# Patient Record
Sex: Male | Born: 1990 | Race: White | Hispanic: No | Marital: Married | Smoking: Former smoker
Health system: Southern US, Community
[De-identification: ages and names within clinical notes are randomized; demographics above are authoritative.]

## PROBLEM LIST (undated history)

## (undated) ENCOUNTER — Emergency Department (HOSPITAL_COMMUNITY): Admission: EM | Payer: BLUE CROSS/BLUE SHIELD | Source: Home / Self Care

## (undated) DIAGNOSIS — N200 Calculus of kidney: Secondary | ICD-10-CM

## (undated) DIAGNOSIS — Q8740 Marfan's syndrome, unspecified: Secondary | ICD-10-CM

## (undated) HISTORY — DX: Calculus of kidney: N20.0

## (undated) HISTORY — PX: TYMPANOSTOMY TUBE PLACEMENT: SHX32

---
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
## 2010-01-24 ENCOUNTER — Emergency Department: Payer: Self-pay | Admitting: Emergency Medicine

## 2010-07-24 ENCOUNTER — Emergency Department: Payer: Self-pay | Admitting: Internal Medicine

## 2010-12-18 ENCOUNTER — Encounter: Payer: Self-pay | Admitting: Physical Medicine and Rehabilitation

## 2011-01-02 ENCOUNTER — Encounter: Payer: Self-pay | Admitting: Physical Medicine and Rehabilitation

## 2011-11-16 ENCOUNTER — Emergency Department: Payer: Self-pay | Admitting: *Deleted

## 2013-05-25 ENCOUNTER — Emergency Department: Payer: Self-pay | Admitting: Emergency Medicine

## 2013-05-25 LAB — COMPREHENSIVE METABOLIC PANEL WITH GFR
Albumin: 4.6 g/dL
Alkaline Phosphatase: 59 U/L
Anion Gap: 4 — ABNORMAL LOW
BUN: 12 mg/dL
Bilirubin,Total: 0.6 mg/dL
Calcium, Total: 9.6 mg/dL
Chloride: 105 mmol/L
Co2: 28 mmol/L
Creatinine: 0.91 mg/dL
EGFR (African American): >60
EGFR (Non-African Amer.): >60
Glucose: 81 mg/dL
Osmolality: 273
Potassium: 4 mmol/L
SGOT(AST): 27 U/L
SGPT (ALT): 33 U/L
Sodium: 137 mmol/L
Total Protein: 8.4 g/dL — ABNORMAL HIGH

## 2013-05-25 LAB — URINALYSIS, COMPLETE
Bacteria: NONE SEEN
Bilirubin,UR: NEGATIVE
Glucose,UR: NEGATIVE mg/dL
Hyaline Cast: 2
Ketone: NEGATIVE
Leukocyte Esterase: NEGATIVE
Nitrite: NEGATIVE
Ph: 5
Protein: NEGATIVE
RBC,UR: 22 /HPF
Specific Gravity: 1.019
Squamous Epithelial: <1
WBC UR: 1 /HPF

## 2013-05-25 LAB — CBC
HCT: 46.1 % (ref 40.0–52.0)
HGB: 15.9 g/dL (ref 13.0–18.0)
MCH: 29.4 pg (ref 26.0–34.0)
MCHC: 34.4 g/dL (ref 32.0–36.0)
MCV: 86 fL (ref 80–100)
Platelet: 191 10*3/uL (ref 150–440)
RBC: 5.39 10*6/uL (ref 4.40–5.90)
RDW: 12.7 % (ref 11.5–14.5)

## 2013-05-25 LAB — TROPONIN I: Troponin-I: <0.02 ng/mL

## 2013-05-25 LAB — LIPASE, BLOOD: Lipase: 106 U/L

## 2013-05-30 DIAGNOSIS — N4889 Other specified disorders of penis: Secondary | ICD-10-CM | POA: Insufficient documentation

## 2013-05-30 DIAGNOSIS — R39198 Other difficulties with micturition: Secondary | ICD-10-CM | POA: Insufficient documentation

## 2013-05-30 DIAGNOSIS — R3911 Hesitancy of micturition: Secondary | ICD-10-CM | POA: Insufficient documentation

## 2013-05-30 DIAGNOSIS — R3129 Other microscopic hematuria: Secondary | ICD-10-CM | POA: Insufficient documentation

## 2013-12-28 ENCOUNTER — Emergency Department: Payer: Self-pay | Admitting: Emergency Medicine

## 2013-12-28 LAB — CBC WITH DIFFERENTIAL/PLATELET
BASOS ABS: 0.2 10*3/uL — AB (ref 0.0–0.1)
Basophil %: 3 %
Eosinophil #: 0.2 10*3/uL (ref 0.0–0.7)
Eosinophil %: 3.3 %
HCT: 43.6 % (ref 40.0–52.0)
HGB: 14.5 g/dL (ref 13.0–18.0)
LYMPHS ABS: 1.2 10*3/uL (ref 1.0–3.6)
LYMPHS PCT: 17.3 %
MCH: 28.8 pg (ref 26.0–34.0)
MCHC: 33.3 g/dL (ref 32.0–36.0)
MCV: 87 fL (ref 80–100)
MONO ABS: 0.8 x10 3/mm (ref 0.2–1.0)
MONOS PCT: 10.5 %
NEUTROS ABS: 4.7 10*3/uL (ref 1.4–6.5)
Neutrophil %: 65.9 %
Platelet: 183 10*3/uL (ref 150–440)
RBC: 5.04 10*6/uL (ref 4.40–5.90)
RDW: 12.6 % (ref 11.5–14.5)
WBC: 7.2 10*3/uL (ref 3.8–10.6)

## 2013-12-28 LAB — URINALYSIS, COMPLETE
BACTERIA: NONE SEEN
Bilirubin,UR: NEGATIVE
Glucose,UR: NEGATIVE mg/dL (ref 0–75)
LEUKOCYTE ESTERASE: NEGATIVE
NITRITE: NEGATIVE
Ph: 7 (ref 4.5–8.0)
SPECIFIC GRAVITY: 1.018 (ref 1.003–1.030)
SQUAMOUS EPITHELIAL: NONE SEEN
WBC UR: NONE SEEN /HPF (ref 0–5)

## 2013-12-28 LAB — COMPREHENSIVE METABOLIC PANEL
ALBUMIN: 4.2 g/dL (ref 3.4–5.0)
ALK PHOS: 47 U/L
ALT: 18 U/L (ref 12–78)
AST: 15 U/L (ref 15–37)
Anion Gap: 10 (ref 7–16)
BUN: 7 mg/dL (ref 7–18)
Bilirubin,Total: 0.9 mg/dL (ref 0.2–1.0)
CO2: 24 mmol/L (ref 21–32)
CREATININE: 0.75 mg/dL (ref 0.60–1.30)
Calcium, Total: 9.4 mg/dL (ref 8.5–10.1)
Chloride: 103 mmol/L (ref 98–107)
EGFR (Non-African Amer.): >60
GLUCOSE: 90 mg/dL (ref 65–99)
OSMOLALITY: 271 (ref 275–301)
POTASSIUM: 3.5 mmol/L (ref 3.5–5.1)
Sodium: 137 mmol/L (ref 136–145)
TOTAL PROTEIN: 7.8 g/dL (ref 6.4–8.2)

## 2013-12-28 LAB — LIPASE, BLOOD: Lipase: 86 U/L (ref 73–393)

## 2014-05-21 DIAGNOSIS — F419 Anxiety disorder, unspecified: Secondary | ICD-10-CM | POA: Insufficient documentation

## 2014-05-21 DIAGNOSIS — R251 Tremor, unspecified: Secondary | ICD-10-CM | POA: Insufficient documentation

## 2014-11-09 ENCOUNTER — Encounter: Payer: Self-pay | Admitting: Cardiology

## 2014-11-09 ENCOUNTER — Ambulatory Visit (INDEPENDENT_AMBULATORY_CARE_PROVIDER_SITE_OTHER): Payer: BLUE CROSS/BLUE SHIELD | Admitting: Cardiology

## 2014-11-09 VITALS — BP 124/86 | HR 74 | Ht 78.0 in | Wt 174.0 lb

## 2014-11-09 DIAGNOSIS — Q8740 Marfan's syndrome, unspecified: Secondary | ICD-10-CM

## 2014-11-09 NOTE — Progress Notes (Signed)
Cardiology Office Note   Date:  11/09/2014   ID:  Tom [name], DOB 11-14-1990, MRN [id number]  PCP:  No primary care provider on file.  Cardiologist:   Rollene Rotunda, MD   Chief Complaint  Patient presents with  . Rule out Marfans      History of Present Illness: Tom [name] is a 24 y.o. male who presents to rule out Marfan's. His paternal grand father had this. He was surveyed as a child with an ultrasound was never given a diagnosis and then did not want to go back for follow-up. He's now expecting his first child and it was suggested by his wife's OB/GYN that he get tested for Marfan's. He has been well. He has a very physical job. He does have some chest soreness because he lives quite a bit. This is a sternal discomfort. This happens while he is working and can happen afterwards. He does not describe a substernal discomfort however. He doesn't have any palpitations, presyncope or syncope. He denies any shortness of breath, PND or orthopnea. He's had no weight gain or edema.    Past Medical History  Diagnosis Date  . Kidney stones     Past Surgical History  Procedure Laterality Date  . Tympanostomy tube placement      multiple times as a child     Current Outpatient Prescriptions  Medication Sig Dispense Refill  . oxyCODONE (OXY IR/ROXICODONE) 5 MG immediate release tablet Take 5 mg by mouth.     No current facility-administered medications for this visit.    Allergies:   Gabapentin    Social History:  The patient  reports that he has quit smoking. He does not have any smokeless tobacco history on file. He reports that he drinks alcohol.   Family History:  The patient's family history includes Cancer in his mother; Hypertension in his father; Marfan syndrome in his maternal grandfather; Pectus excavatum in his mother.    ROS:  Please see the history of present illness.   Otherwise, review of systems are positive for none.   All other systems are reviewed  and negative.    PHYSICAL EXAM: VS:  BP 124/86 mmHg  Ht  (1.981 m)  Wt 174 lb (78.926 kg)  BMI 20.11 kg/m2 , BMI Body mass index is 20.11 kg/(m^2). GENERAL:  Well appearing HEENT:  Pupils equal round and reactive, fundi not visualized, oral mucosa unremarkable, high arching palate NECK:  No jugular venous distention, waveform within normal limits, carotid upstroke brisk and symmetric, no bruits, no thyromegaly LYMPHATICS:  No cervical, inguinal adenopathy LUNGS:  Clear to auscultation bilaterally BACK:  No CVA tenderness CHEST:  Unremarkable HEART:  PMI not displaced or sustained,S1 and S2 within normal limits, no S3, no S4, no clicks, no rubs, no murmurs ABD:  Flat, positive bowel sounds normal in frequency in pitch, no bruits, no rebound, no guarding, no midline pulsatile mass, no hepatomegaly, no splenomegaly EXT:  2 plus pulses throughout, no edema, no cyanosis no clubbing, 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 The ekg ordered today demonstrates sinus rhythm, rate 74, axis within normal limits, intervals within normal limits, early repolarization pattern.   Recent Labs: No results found for requested labs within last 365 days.  Lipid Panel No results found for: CHOL, TRIG, HDL, CHOLHDL, VLDL, LDLCALC, LDLDIRECT    Wt Readings from Last 3 Encounters:  11/09/14 174 lb (78.926 kg)      Other studies Reviewed: Additional studies/ records that were reviewed today include: None.    ASSESSMENT AND PLAN:  RULE OUT MARFANS:  The patient has the body habitus consistent with this although I have not yet done the official scoring. I suspect that he does have this. He has a family history. This will be one criteria. I will start with a Z score  calculation on his echo. This will be enough to make a diagnosis. If not we will consider other workup such as slipped lamp eye exam.  I will start with an echocardiogram.   Current medicines are reviewed at length with the patient today.  The patient does not have concerns regarding medicines.  The following changes have been made:  no change  Labs/ tests ordered today include: Echo  No orders of the defined types were placed in this encounter.     Disposition:   FU with me in one year.     Signed, Rollene RotundaJames Sharlee Rufino, MD  11/09/2014 8:58 AM    [facility]

## 2014-11-09 NOTE — Patient Instructions (Signed)
Your physician wants you to follow-up in: 1 Year. You will receive a reminder letter in the mail two months in advance. If you don't receive a letter, please call our office to schedule the follow-up appointment.  Your physician has requested that you have an echocardiogram. Echocardiography is a painless test that uses sound waves to create images of your heart. It provides your doctor with information about the size and shape of your heart and how well your heart's Kassabian and valves are working. This procedure takes approximately one hour. There are no restrictions for this procedure.    

## 2014-11-14 ENCOUNTER — Ambulatory Visit (HOSPITAL_COMMUNITY)
Admission: RE | Admit: 2014-11-14 | Discharge: 2014-11-14 | Disposition: A | Discharge status: Home / Self Care | Payer: BLUE CROSS/BLUE SHIELD | Source: Ambulatory Visit | Attending: Internal Medicine | Admitting: Internal Medicine

## 2014-11-14 DIAGNOSIS — Q8740 Marfan's syndrome, unspecified: Secondary | ICD-10-CM | POA: Insufficient documentation

## 2014-11-14 NOTE — Progress Notes (Signed)
2D Echo Performed 11/14/2014    Leigh Kaeding, RCS  

## 2014-11-16 DIAGNOSIS — Q8740 Marfan's syndrome, unspecified: Secondary | ICD-10-CM | POA: Insufficient documentation

## 2015-09-24 DIAGNOSIS — R109 Unspecified abdominal pain: Secondary | ICD-10-CM | POA: Insufficient documentation

## 2015-09-24 DIAGNOSIS — S3991XA Unspecified injury of abdomen, initial encounter: Secondary | ICD-10-CM | POA: Insufficient documentation

## 2015-09-24 DIAGNOSIS — D649 Anemia, unspecified: Secondary | ICD-10-CM | POA: Insufficient documentation

## 2015-09-24 HISTORY — DX: Unspecified injury of abdomen, initial encounter: S39.91XA

## 2015-10-02 DIAGNOSIS — N200 Calculus of kidney: Secondary | ICD-10-CM | POA: Insufficient documentation

## 2015-10-02 DIAGNOSIS — H9190 Unspecified hearing loss, unspecified ear: Secondary | ICD-10-CM | POA: Insufficient documentation

## 2015-10-02 DIAGNOSIS — F339 Major depressive disorder, recurrent, unspecified: Secondary | ICD-10-CM | POA: Insufficient documentation

## 2015-10-02 DIAGNOSIS — R0789 Other chest pain: Secondary | ICD-10-CM | POA: Insufficient documentation

## 2015-10-21 DIAGNOSIS — R319 Hematuria, unspecified: Secondary | ICD-10-CM | POA: Insufficient documentation

## 2018-10-05 ENCOUNTER — Emergency Department (HOSPITAL_COMMUNITY)
Admission: EM | Admit: 2018-10-05 | Discharge: 2018-10-05 | Disposition: A | Discharge status: Home / Self Care | Payer: 59 | Attending: Emergency Medicine | Admitting: Emergency Medicine

## 2018-10-05 ENCOUNTER — Emergency Department (HOSPITAL_COMMUNITY): Payer: 59

## 2018-10-05 ENCOUNTER — Encounter (HOSPITAL_COMMUNITY): Payer: Self-pay | Admitting: *Deleted

## 2018-10-05 ENCOUNTER — Other Ambulatory Visit: Payer: Self-pay

## 2018-10-05 DIAGNOSIS — R319 Hematuria, unspecified: Secondary | ICD-10-CM | POA: Insufficient documentation

## 2018-10-05 DIAGNOSIS — R1032 Left lower quadrant pain: Secondary | ICD-10-CM | POA: Diagnosis present

## 2018-10-05 DIAGNOSIS — N200 Calculus of kidney: Secondary | ICD-10-CM | POA: Diagnosis not present

## 2018-10-05 DIAGNOSIS — Z87891 Personal history of nicotine dependence: Secondary | ICD-10-CM | POA: Insufficient documentation

## 2018-10-05 DIAGNOSIS — R109 Unspecified abdominal pain: Secondary | ICD-10-CM

## 2018-10-05 DIAGNOSIS — R112 Nausea with vomiting, unspecified: Secondary | ICD-10-CM | POA: Diagnosis not present

## 2018-10-05 HISTORY — DX: Calculus of kidney: N20.0

## 2018-10-05 LAB — CBC WITH DIFFERENTIAL/PLATELET
ABS IMMATURE GRANULOCYTES: 0.03 10*3/uL (ref 0.00–0.07)
Basophils Absolute: 0 10*3/uL (ref 0.0–0.1)
Basophils Relative: 1 %
Eosinophils Absolute: 0.4 10*3/uL (ref 0.0–0.5)
Eosinophils Relative: 5 %
HCT: 39.8 % (ref 39.0–52.0)
Hemoglobin: 13.1 g/dL (ref 13.0–17.0)
IMMATURE GRANULOCYTES: 1 %
Lymphocytes Relative: 23 %
Lymphs Abs: 1.5 10*3/uL (ref 0.7–4.0)
MCH: 28.2 pg (ref 26.0–34.0)
MCHC: 32.9 g/dL (ref 30.0–36.0)
MCV: 85.8 fL (ref 80.0–100.0)
Monocytes Absolute: 1 10*3/uL (ref 0.1–1.0)
Monocytes Relative: 15 %
NEUTROS ABS: 3.7 10*3/uL (ref 1.7–7.7)
NEUTROS PCT: 55 %
NRBC: 0 % (ref 0.0–0.2)
PLATELETS: 191 10*3/uL (ref 150–400)
RBC: 4.64 MIL/uL (ref 4.22–5.81)
RDW: 12.2 % (ref 11.5–15.5)
WBC: 6.5 10*3/uL (ref 4.0–10.5)

## 2018-10-05 LAB — BASIC METABOLIC PANEL WITH GFR
Anion gap: 8 (ref 5–15)
BUN: 9 mg/dL (ref 6–20)
CO2: 26 mmol/L (ref 22–32)
Calcium: 9.4 mg/dL (ref 8.9–10.3)
Chloride: 102 mmol/L (ref 98–111)
Creatinine, Ser: 0.88 mg/dL (ref 0.61–1.24)
GFR calc Af Amer: >60 mL/min
GFR calc non Af Amer: >60 mL/min
Glucose, Bld: 69 mg/dL — ABNORMAL LOW (ref 70–99)
Potassium: 3.7 mmol/L (ref 3.5–5.1)
Sodium: 136 mmol/L (ref 135–145)

## 2018-10-05 LAB — URINALYSIS, ROUTINE W REFLEX MICROSCOPIC
Bilirubin Urine: NEGATIVE
Glucose, UA: NEGATIVE mg/dL
Hgb urine dipstick: NEGATIVE
Ketones, ur: NEGATIVE mg/dL
LEUKOCYTE UA: NEGATIVE
Nitrite: NEGATIVE
PROTEIN: NEGATIVE mg/dL
Specific Gravity, Urine: 1.016 (ref 1.005–1.030)
pH: 5 (ref 5.0–8.0)

## 2018-10-05 MED ORDER — NAPROXEN 500 MG PO TABS: 500.0000 mg | ORAL_TABLET | Freq: Two times a day (BID) | ORAL | 0 refills | Status: DC

## 2018-10-05 MED ORDER — ONDANSETRON 4 MG PO TBDP: 4.0000 mg | ORAL_TABLET | Freq: Three times a day (TID) | ORAL | 0 refills | Status: DC | PRN

## 2018-10-05 MED ORDER — KETOROLAC TROMETHAMINE 30 MG/ML IJ SOLN
30.0000 mg | Freq: Once | INTRAMUSCULAR | Status: AC
  Administered 2018-10-05: 30 mg via INTRAMUSCULAR
  Filled 2018-10-05: qty 1

## 2018-10-05 NOTE — ED Triage Notes (Signed)
Pain  Started Tuesday morning . Pt has daily Kidney stones. Pt reports his urine is not usually red in color. Pt urine is yellow sample given today.

## 2018-10-05 NOTE — Discharge Instructions (Addendum)
You have been seen today for flank pain and blood in urine. Please read and follow all provided instructions.   1. Medications: naproxen for pain, zofran for nausea, usual home medications 2. Treatment: rest, drink plenty of fluids 3. Follow Up: Please follow up with urology for further assessment of kidney stones. Please follow up with your primary doctor in 7 days for discussion of your diagnoses and further evaluation after today's visit; if you do not have a primary care doctor use the resource guide provided to find one; Please return to the ER for any new or worsening symptoms. Please obtain all of your results from medical records or have your doctors office obtain the results - share them with your doctor - you should be seen at your doctors office. Call today to arrange your follow up.   Take medications as prescribed. Please review all of the medicines and only take them if you do not have an allergy to them. Return to the emergency room for worsening condition or new concerning symptoms. Follow up with your regular doctor. If you don't have a regular doctor use one of the numbers below to establish a primary care doctor.  Please be aware that if you are taking birth control pills, taking other prescriptions, ESPECIALLY ANTIBIOTICS may make the birth control ineffective - if this is the case, either do not engage in sexual activity or use alternative methods of birth control such as condoms until you have finished the medicine and your family doctor says it is OK to restart them. If you are on a blood thinner such as COUMADIN, be aware that any other medicine that you take may cause the coumadin to either work too much, or not enough - you should have your coumadin level rechecked in next 7 days if this is the case.  ?  It is also a possibility that you have an allergic reaction to any of the medicines that you have been prescribed - Everybody reacts differently to medications and while MOST  people have no trouble with most medicines, you may have a reaction such as nausea, vomiting, rash, swelling, shortness of breath. If this is the case, please stop taking the medicine immediately and contact your physician.  ?  You should return to the ER if you develop severe or worsening symptoms.   Emergency Department Resource Guide 1) Find a Doctor and Pay Out of Pocket Although you won't have to find out who is covered by your insurance plan, it is a good idea to ask around and get recommendations. You will then need to call the office and see if the doctor you have chosen will accept you as a new patient and what types of options they offer for patients who are self-pay. Some doctors offer discounts or will set up payment plans for their patients who do not have insurance, but you will need to ask so you aren't surprised when you get to your appointment.  2) Contact Your Local Health Department Not all health departments have doctors that can see patients for sick visits, but many do, so it is worth a call to see if yours does. If you don't know where your local health department is, you can check in your phone book. The CDC also has a tool to help you locate your state's health department, and many state websites also have listings of all of their local health departments.  3) Find a Walk-in Clinic If your illness is not likely  to be very severe or complicated, you may want to try a walk in clinic. These are popping up all over the country in pharmacies, drugstores, and shopping centers. They're usually staffed by nurse practitioners or physician assistants that have been trained to treat common illnesses and complaints. They're usually fairly quick and inexpensive. However, if you have serious medical issues or chronic medical problems, these are probably not your best option.  No Primary Care Doctor: Call Health Connect at  [phone] - they can help you locate a primary care doctor that   accepts your insurance, provides certain services, etc. Physician Referral [phone]  Emergency Department Resource Guide 1) Find a Doctor and Pay Out of Pocket Although you won't have to find out who is covered by your insurance plan, it is a good idea to ask around and get recommendations. You will then need to call the office and see if the doctor you have chosen will accept you as a new patient and what types of options they offer for patients who are self-pay. Some doctors offer discounts or will set up payment plans for their patients who do not have insurance, but you will need to ask so you aren't surprised when you get to your appointment.  2) Contact Your Local Health Department Not all health departments have doctors that can see patients for sick visits, but many do, so it is worth a call to see if yours does. If you don't know where your local health department is, you can check in your phone book. The CDC also has a tool to help you locate your state's health department, and many state websites also have listings of all of their local health departments.  3) Find a Walk-in Clinic If your illness is not likely to be very severe or complicated, you may want to try a walk in clinic. These are popping up all over the country in pharmacies, drugstores, and shopping centers. They're usually staffed by nurse practitioners or physician assistants that have been trained to treat common illnesses and complaints. They're usually fairly quick and inexpensive. However, if you have serious medical issues or chronic medical problems, these are probably not your best option.  No Primary Care Doctor: Call Health Connect at  [phone] - they can help you locate a primary care doctor that  accepts your insurance, provides certain services, etc. Physician Referral Service- [phone]  Chronic Pain Problems: Organization         Address  Phone   Notes  [facility]   [phone] Patients need to be referred by their primary care doctor.   Medication Assistance: Organization         Address  Phone   Notes  [facility] Medication [facility] [address] [place] [place], [place] [address] [phone] --Must be a resident of [facility] -- Must have NO insurance coverage whatsoever (no Medicaid/ Medicare, etc.) -- The pt. MUST have a primary care doctor that directs their care regularly and follows them in the community   [facility]  [phone]   [employer]  [phone]    Agencies that provide inexpensive medical care: Organization         Address  Phone   Notes  [facility]  [phone]   [facility]    [phone]   [facility] [address] [place], [place] [address] [phone]   [facility] [address] [place] [phone]   [facility]    [phone]   [facility]    [phone]   [facility]  [address], [place] Phone:  [phone], Fax:  [phone] Hours of Operation:  9 am - 6 pm, M-F.  Also accepts Medicaid/Medicare and self-pay.  [facility]  [address], [place] Phone: [phone], Fax: [phone]. Hours of Operation:  8:30 am - 5:30 pm, M-F.  Also accepts Medicaid and self-pay.  [facility] [place] [address] [place] Phone: [phone]   [facility] [address] Natasha Bence [place], [place] [phone], Ext. [phone] Mondays & Thursdays: 7-9 AM.  First 15 patients are seen on a first come, first serve basis.    Medicaid-accepting [facility] Providers:  Organization         Address  Phone   Notes  [facility] [address], [place] [phone] Also accepts self-pay patients.  [facility] [address] Laurell Josephs [place],  [place]  [phone]   [facility] [address], [place] [phone]   [facility] [address] [place] [phone]   Renaye Rakers [address], [place]   [phone] Only accepts [place] patients after they have their name applied to their card.   Self-Pay (no insurance) in [facility]:  Organization         Address  Phone   Notes  [facility] Patients, [facility] [address] [place], [place] [phone]   [facility] [address] [place], [place] [phone]   [facility]  [address], [place] [phone]   [facility]/Dr. Osei-Bonsu  [address], [place] or [address], [place] [phone] Phone number for both [place] and [place] locations is the same.  [facility] [address], [place] [phone]   [facility] [address] [place] or [address] [phone]   [facility] [address] [place] [phone], phone; [phone], fax Sees patients 1st and 3rd Saturday of every month.  Must not qualify for public or private insurance (i.e. Medicaid, Medicare, [place] [employer] Choice, Veterans' Benefits)  Household income should be no more than 200% of the poverty level The clinic cannot treat you if you are pregnant or think you are pregnant  Sexually transmitted diseases are not treated at the clinic.

## 2018-10-05 NOTE — ED Provider Notes (Signed)
[facility] EMERGENCY DEPARTMENT Provider Note   CSN: [id number] Arrival date & time: 10/05/18  1138    History   Chief Complaint Chief Complaint  Patient presents with  . Flank Pain    HPI Tom [name] is a 28 y.o. male with a PMH of kidney stones and Marfan's disease presents with hematuria and left flank pain onset yesterday morning. Patient describes pain as a constant sharp pain that radiates to left groin. Patient reports hematuria as he urinated 2 kidney stones this morning. Patient states pain is worse today. Patient reports pain is worse with certain movements and better with rest. Patient reports he has recurrent kidney stones daily since he was 28 years old. Patient states he was evaluated by urology years ago, but denies having a current urologist. Patient reports he does not want a urologist. Patient states he took a percocet from a friend yesterday with partial relief. Patient reports dysuria. Patient reports nausea and two episodes of vomiting this morning. Patient reports cough and congestion for 2 days, but denies abdominal pain, diarrhea, sore throat, body aches, or headaches. Patient reports grandmother had a history of daily kidney stones as well.      HPI  Past Medical History:  Diagnosis Date  . Kidney calculi   . Kidney stones     Patient Active Problem List   Diagnosis Date Noted  . 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 TYMPANOSTOMY TUBE PLACEMENT     multiple times as a child        Home Medications    Prior to Admission medications   Medication Sig Start Date End Date Taking? Authorizing Provider  naproxen (NAPROSYN) 500 MG tablet Take 1 tablet (500 mg total) by mouth 2 (two) times daily. 10/05/18   Ardyth Harps, Jadasia Haws P, PA-C  ondansetron (ZOFRAN ODT) 4 MG disintegrating tablet Take 1 tablet (4 mg total) by mouth every 8 (eight) hours as needed for nausea or vomiting. 10/05/18    Carlyle Basques P, PA-C  oxyCODONE (OXY IR/ROXICODONE) 5 MG immediate release tablet Take 5 mg by mouth. 09/18/14   [provider]    Family History Family History  Problem Relation Age of Onset  . Hypertension Father   . Marfan syndrome Maternal Grandfather   . Cancer Mother        skin  . Pectus excavatum Mother     Social History Social History   Tobacco Use  . Smoking status: Former Games developer  . Smokeless tobacco: Never Used  Substance Use Topics  . Alcohol use: Yes    Alcohol/week: 0.0 standard drinks    Comment: rarely  . Drug use: Never     Allergies   Gabapentin   Review of Systems Review of Systems  Constitutional: Negative for activity change, appetite change, chills, fever and unexpected weight change.  HENT: Positive for congestion and rhinorrhea. Negative for sore throat.   Eyes: Negative for visual disturbance.  Respiratory: Negative for cough and shortness of breath.   Cardiovascular: Negative for chest pain.  Gastrointestinal: Positive for nausea and vomiting. Negative for abdominal pain, constipation and diarrhea.  Endocrine: Negative for polydipsia, polyphagia and polyuria.  Genitourinary: Positive for dysuria, flank pain and hematuria. Negative for difficulty urinating and frequency.  Musculoskeletal: Negative for back pain.  Skin: Negative for rash.  Allergic/Immunologic: Negative for immunocompromised state.  Psychiatric/Behavioral: The patient is not nervous/anxious.      Physical Exam Updated Vital Signs  BP (!) 131/98 (BP Location: Right Arm)   Pulse 64   Temp 97.8 F (36.6 C) (Oral)   Resp 16   Ht 6\' 5"  (1.956 m)   Wt 86.2 kg   SpO2 100%   BMI 22.53 kg/m   Physical Exam Vitals signs and nursing note reviewed.  Constitutional:      General: He is not in acute distress.    Appearance: He is well-developed. He is not diaphoretic.  HENT:     Head: Normocephalic and atraumatic.     Right Ear: Tympanic membrane, ear canal and  external ear normal. No middle ear effusion.     Left Ear: Tympanic membrane, ear canal and external ear normal.  No middle ear effusion.     Nose: Mucosal edema, congestion and rhinorrhea present.     Mouth/Throat:     Mouth: Mucous membranes are moist.     Pharynx: Uvula midline. Posterior oropharyngeal erythema present. No oropharyngeal exudate.  Eyes:     General:        Right eye: No discharge.        Left eye: No discharge.     Conjunctiva/sclera: Conjunctivae normal.  Neck:     Musculoskeletal: Normal range of motion and neck supple.  Cardiovascular:     Rate and Rhythm: Normal rate and regular rhythm.     Heart sounds: Normal heart sounds. No murmur. No friction rub. No gallop.   Pulmonary:     Effort: Pulmonary effort is normal. No respiratory distress.     Breath sounds: Normal breath sounds. No wheezing or rales.  Abdominal:     Palpations: Abdomen is soft.     Tenderness: There is abdominal tenderness (Left flank pain upon palpation.). There is left CVA tenderness. There is no right CVA tenderness or guarding.  Musculoskeletal: Normal range of motion.  Lymphadenopathy:     Cervical: No cervical adenopathy.  Skin:    General: Skin is warm.     Findings: No rash.  Neurological:     Mental Status: He is alert and oriented to person, place, and time.    ED Treatments / Results  Labs (all labs ordered are listed, but only abnormal results are displayed) Labs Reviewed  BASIC METABOLIC PANEL - Abnormal; Notable for the following components:      Result Value   Glucose, Bld 69 (*)    All other components within normal limits  URINALYSIS, ROUTINE W REFLEX MICROSCOPIC  CBC WITH DIFFERENTIAL/PLATELET    EKG None  Radiology Ct Renal Stone Study  Result Date: 10/05/2018 CLINICAL DATA:  Flank pain EXAM: CT ABDOMEN AND PELVIS WITHOUT CONTRAST TECHNIQUE: Multidetector CT imaging of the abdomen and pelvis was performed following the standard protocol without IV contrast.  COMPARISON:  None. FINDINGS: Lower chest: No acute abnormality. Hepatobiliary: No focal liver abnormality is seen. No gallstones, gallbladder wall thickening, or biliary dilatation. Pancreas: Unremarkable. No pancreatic ductal dilatation or surrounding inflammatory changes. Spleen: Normal in size without focal abnormality. Adrenals/Urinary Tract: Adrenal glands are unremarkable. Multiple small nonobstructive calculi are present in the bilateral kidneys. Bladder is unremarkable. Stomach/Bowel: Stomach is within normal limits. Appendix appears normal. No evidence of bowel wall thickening, distention, or inflammatory changes. Vascular/Lymphatic: No significant vascular findings are present. No enlarged abdominal or pelvic lymph nodes. Reproductive: No mass or other abnormality. Other: No abdominal wall hernia or abnormality. No abdominopelvic ascites. Musculoskeletal: No acute or significant osseous findings. IMPRESSION: Multiple small nonobstructive calculi are present in the bilateral kidneys. No  evidence of ureteral calculus or hydronephrosis. Electronically Signed   By: Lauralyn PrimesAlex  Bibbey M.D.   On: 10/05/2018 14:58    Procedures Procedures (including critical care time)  Medications Ordered in ED Medications  ketorolac (TORADOL) 30 MG/ML injection 30 mg (30 mg Intramuscular Given 10/05/18 1230)     Initial Impression / Assessment and Plan / ED Course  I have reviewed the triage vital signs and the nursing notes.  Pertinent labs & imaging results that were available during my care of the patient were reviewed by me and considered in my medical decision making (see chart for details).  Clinical Course as of Oct 05 1507  Wed Oct 05, 2018  1337 UA is unremarkable.   Urinalysis, Routine w reflex microscopic [AH]  1337 CBC is within normal limits.   CBC with Differential [AH]  1338 Glucose is at 69. Will provide orange juice.  Glucose(!): 69 [AH]  1339 Creatinine is within normal limits.  Creatinine:  0.88 [AH]  1340 Patient reports improvement of pain. Patient denies any nausea or vomiting while in the ER.   [AH]  1501 Multiple small nonobstructive calculi are present in the bilateral kidneys. No evidence of ureteral calculus or hydronephrosis.    CT Renal Soundra PilonStone Study [AH]    Clinical Course User Index [AH] Leretha DykesHernandez, Guhan Bruington P, [place]PA-C      Patient presents with left flank pain and hematuria. Patient has a history of kidney stones. UA is unremarkable. Pain improved while in the ER.  Pt has been diagnosed with a Kidney Stone via CT. There is no evidence of significant hydronephrosis, serum creatine WNL, vitals sign stable and the pt does not have intractable vomiting. Discussed low blood glucose with patient. Patient states he has not eaten today and reports he will eat after discharge. Pt will be dc home with pain medications & has been advised to follow up with PCP. Advised patient to follow up with urology due to frequency of kidney stones. Patient states he understands and agrees with plan.  Final Clinical Impressions(s) / ED Diagnoses   Final diagnoses:  Left flank pain  Nephrolithiasis    ED Discharge Orders         Ordered    naproxen (NAPROSYN) 500 MG tablet  2 times daily     10/05/18 1506    ondansetron (ZOFRAN ODT) 4 MG disintegrating tablet  Every 8 hours PRN     10/05/18 [address]1506           Danila Eddie WildwoodP, [place] JerseyPA-C 10/05/18 1510    Arby BarrettePfeiffer, Marcy, MD 10/16/18 1300

## 2018-10-05 NOTE — ED Notes (Signed)
Pt stated "pee'd out two kidney stones" pt currently collecting urine sample

## 2020-01-21 ENCOUNTER — Emergency Department (HOSPITAL_COMMUNITY)
Admission: EM | Admit: 2020-01-21 | Discharge: 2020-01-21 | Disposition: A | Discharge status: Home / Self Care | Payer: PRIVATE HEALTH INSURANCE | Attending: Emergency Medicine | Admitting: Emergency Medicine

## 2020-01-21 ENCOUNTER — Other Ambulatory Visit: Payer: Self-pay

## 2020-01-21 ENCOUNTER — Emergency Department (HOSPITAL_COMMUNITY): Payer: PRIVATE HEALTH INSURANCE

## 2020-01-21 ENCOUNTER — Encounter (HOSPITAL_COMMUNITY): Payer: Self-pay | Admitting: Emergency Medicine

## 2020-01-21 DIAGNOSIS — R1011 Right upper quadrant pain: Secondary | ICD-10-CM | POA: Insufficient documentation

## 2020-01-21 DIAGNOSIS — R197 Diarrhea, unspecified: Secondary | ICD-10-CM | POA: Insufficient documentation

## 2020-01-21 DIAGNOSIS — R112 Nausea with vomiting, unspecified: Secondary | ICD-10-CM | POA: Insufficient documentation

## 2020-01-21 DIAGNOSIS — Z87891 Personal history of nicotine dependence: Secondary | ICD-10-CM | POA: Insufficient documentation

## 2020-01-21 DIAGNOSIS — R1010 Upper abdominal pain, unspecified: Secondary | ICD-10-CM

## 2020-01-21 LAB — BASIC METABOLIC PANEL
Anion gap: 10 (ref 5–15)
BUN: 10 mg/dL (ref 6–20)
CO2: 26 mmol/L (ref 22–32)
Calcium: 10.5 mg/dL — ABNORMAL HIGH (ref 8.9–10.3)
Chloride: 101 mmol/L (ref 98–111)
Creatinine, Ser: 0.92 mg/dL (ref 0.61–1.24)
GFR calc Af Amer: >60 mL/min (ref >60)
GFR calc non Af Amer: >60 mL/min (ref >60)
Glucose, Bld: 106 mg/dL — ABNORMAL HIGH (ref 70–99)
Potassium: 3.9 mmol/L (ref 3.5–5.1)
Sodium: 137 mmol/L (ref 135–145)

## 2020-01-21 LAB — CBC
HCT: 45.8 % (ref 39.0–52.0)
Hemoglobin: 15.4 g/dL (ref 13.0–17.0)
MCH: 28.9 pg (ref 26.0–34.0)
MCHC: 33.6 g/dL (ref 30.0–36.0)
MCV: 86.1 fL (ref 80.0–100.0)
Platelets: 223 10*3/uL (ref 150–400)
RBC: 5.32 MIL/uL (ref 4.22–5.81)
RDW: 12.3 % (ref 11.5–15.5)
WBC: 13.4 10*3/uL — ABNORMAL HIGH (ref 4.0–10.5)
nRBC: 0 % (ref 0.0–0.2)

## 2020-01-21 LAB — URINALYSIS, ROUTINE W REFLEX MICROSCOPIC
Bilirubin Urine: NEGATIVE
Glucose, UA: NEGATIVE mg/dL
Hgb urine dipstick: NEGATIVE
Ketones, ur: NEGATIVE mg/dL
Nitrite: NEGATIVE
Protein, ur: NEGATIVE mg/dL
Specific Gravity, Urine: 1.011 (ref 1.005–1.030)
pH: 9 — ABNORMAL HIGH (ref 5.0–8.0)

## 2020-01-21 LAB — LIPASE, BLOOD: Lipase: 24 U/L (ref 11–51)

## 2020-01-21 LAB — HEPATIC FUNCTION PANEL
ALT: 43 U/L (ref 0–44)
AST: 31 U/L (ref 15–41)
Albumin: 4.4 g/dL (ref 3.5–5.0)
Alkaline Phosphatase: 50 U/L (ref 38–126)
Bilirubin, Direct: 0.1 mg/dL (ref 0.0–0.2)
Indirect Bilirubin: 0.5 mg/dL (ref 0.3–0.9)
Total Bilirubin: 0.6 mg/dL (ref 0.3–1.2)
Total Protein: 8 g/dL (ref 6.5–8.1)

## 2020-01-21 MED ORDER — DICYCLOMINE HCL 10 MG PO CAPS
10.0000 mg | ORAL_CAPSULE | Freq: Three times a day (TID) | ORAL | 0 refills | Status: AC | PRN
Start: 2020-01-21 — End: ?

## 2020-01-21 MED ORDER — ONDANSETRON 4 MG PO TBDP
4.0000 mg | ORAL_TABLET | Freq: Three times a day (TID) | ORAL | 0 refills | Status: DC | PRN
Start: 2020-01-21 — End: 2023-03-18

## 2020-01-21 MED ORDER — DICYCLOMINE HCL 10 MG PO CAPS
10.0000 mg | ORAL_CAPSULE | Freq: Once | ORAL | Status: AC
  Administered 2020-01-21: 10 mg via ORAL
  Filled 2020-01-21: qty 1

## 2020-01-21 MED ORDER — KETOROLAC TROMETHAMINE 30 MG/ML IJ SOLN
30.0000 mg | Freq: Once | INTRAMUSCULAR | Status: AC
  Administered 2020-01-21: 30 mg via INTRAVENOUS
  Filled 2020-01-21: qty 1

## 2020-01-21 MED ORDER — ONDANSETRON HCL 4 MG/2ML IJ SOLN
4.0000 mg | Freq: Once | INTRAMUSCULAR | Status: AC
  Administered 2020-01-21: 4 mg via INTRAVENOUS
  Filled 2020-01-21: qty 2

## 2020-01-21 MED ORDER — LACTATED RINGERS IV BOLUS
1000.0000 mL | Freq: Once | INTRAVENOUS | Status: AC
  Administered 2020-01-21: 1000 mL via INTRAVENOUS

## 2020-01-21 NOTE — ED Notes (Signed)
Patient denies pain and is resting comfortably.  

## 2020-01-21 NOTE — ED Notes (Signed)
To ultrasound

## 2020-01-21 NOTE — ED Provider Notes (Signed)
[facility] EMERGENCY DEPARTMENT Provider Note   CSN: [id number] Arrival date & time: 01/21/20  3254     History Chief Complaint  Patient presents with  . Nausea, vomiting, diarrhea, abdominal pain    Tom [name] is a 29 y.o. male.  29 year old male with past medical history including Marfan's and kidney stones who presents with nausea, vomiting, diarrhea, and abdominal pain.  Patient began having significant watery diarrhea and multiple episodes of vomiting overnight.  He had associated right upper quadrant abdominal pain that was initially severe but has settled down to a dull ache currently.  He reports extensive history of kidney stones and states that he usually passes multiple stones daily.  Yesterday he only passed a few and noted brown urine but no recent urinary changes.  T-max 100.1.  No cough/cold symptoms, sick contacts, or recent travel.  No recent antibiotic use.  The history is provided by the patient.  Flank Pain       Past Medical History:  Diagnosis Date  . Kidney calculi   . Kidney stones     Patient Active Problem List   Diagnosis Date Noted  . 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 TYMPANOSTOMY TUBE PLACEMENT     multiple times as a child       Family History  Problem Relation Age of Onset  . Hypertension Father   . Marfan syndrome Maternal Grandfather   . Cancer Mother        skin  . Pectus excavatum Mother     Social History   Tobacco Use  . Smoking status: Former Games developer  . Smokeless tobacco: Never Used  Substance Use Topics  . Alcohol use: Yes    Alcohol/week: 0.0 standard drinks    Comment: rarely  . Drug use: Never    Home Medications Prior to Admission medications   Medication Sig Start Date End Date Taking? Authorizing Provider  dicyclomine (BENTYL) 10 MG capsule Take 1 capsule (10 mg total) by mouth 3 (three) times daily as needed for spasms. 01/21/20    Mervyn Pflaum, Ambrose [place], MD  ondansetron (ZOFRAN ODT) 4 MG disintegrating tablet Take 1 tablet (4 mg total) by mouth every 8 (eight) hours as needed for nausea or vomiting. 01/21/20   Ona Roehrs, Ambrose [place], MD    Allergies    Gabapentin  Review of Systems   Review of Systems  Genitourinary: Positive for flank pain.  All other systems reviewed and are negative except that which was mentioned in HPI   Physical Exam Updated Vital Signs BP 133/90 (BP Location: Right Arm)   Pulse 89   Temp 97.8 F (36.6 C) (Oral)   Resp 17   SpO2 96%   Physical Exam Vitals and nursing note reviewed.  Constitutional:      General: He is not in acute distress.    Appearance: He is well-developed.  HENT:     Head: Normocephalic and atraumatic.     Nose: Nose normal.     Mouth/Throat:     Mouth: Mucous membranes are moist.     Pharynx: Oropharynx is clear. No oropharyngeal exudate or posterior oropharyngeal erythema.  Eyes:     Conjunctiva/sclera: Conjunctivae normal.  Cardiovascular:     Rate and Rhythm: Normal rate and regular rhythm.     Heart sounds: Normal heart sounds. No murmur heard.   Pulmonary:     Effort: Pulmonary effort is normal.     Breath  sounds: Normal breath sounds.  Abdominal:     General: Bowel sounds are normal. There is no distension.     Palpations: Abdomen is soft.     Tenderness: There is abdominal tenderness (RUQ).  Musculoskeletal:     Cervical back: Neck supple.  Skin:    General: Skin is warm and dry.  Neurological:     Mental Status: He is alert and oriented to person, place, and time.     Comments: Fluent speech  Psychiatric:        Judgment: Judgment normal.     ED Results / Procedures / Treatments   Labs (all labs ordered are listed, but only abnormal results are displayed) Labs Reviewed  URINALYSIS, ROUTINE W REFLEX MICROSCOPIC - Abnormal; Notable for the following components:      Result Value   pH 9.0 (*)    Leukocytes,Ua TRACE (*)    Bacteria,  UA RARE (*)    All other components within normal limits  CBC - Abnormal; Notable for the following components:   WBC 13.4 (*)    All other components within normal limits  BASIC METABOLIC PANEL - Abnormal; Notable for the following components:   Glucose, Bld 106 (*)    Calcium 10.5 (*)    All other components within normal limits  URINE CULTURE  HEPATIC FUNCTION PANEL  LIPASE, BLOOD  HEPATIC FUNCTION PANEL  LIPASE, BLOOD    EKG None  Radiology US Abdomen Limited  Result Date: 01/21/2020 CLINICAL DATA:  Right upper quadrant pain. EXAM: ULTRASOUND ABDOMEN LIMITED RIGHT UPPER QUADRANT COMPARISON:  None. FINDINGS: Gallbladder: No gallstones or wall thickening visualized. No sonographic Murphy sign noted by sonographer. Common bile duct: Diameter: 1.7 mm Liver: No focal lesion identified. Within normal limits in parenchymal echogenicity. Portal vein is patent on color Doppler imaging with normal direction of blood flow towards the liver. Other: Multiple stones are visualized in the right kidney with the largest measuring 3 x 6 x 4 mm. No hydronephrosis identified. IMPRESSION: 1. Nonobstructive right renal stones. 2. No other abnormalities. Electronically Signed   By: Dorise Bullion III M.D   On: 01/21/2020 13:40    Procedures Procedures (including critical care time)  Medications Ordered in ED Medications  lactated ringers bolus 1,000 mL (0 mLs Intravenous Stopped 01/21/20 1256)  ondansetron (ZOFRAN) injection 4 mg (4 mg Intravenous Given 01/21/20 1112)  dicyclomine (BENTYL) capsule 10 mg (10 mg Oral Given 01/21/20 1257)  ketorolac (TORADOL) 30 MG/ML injection 30 mg (30 mg Intravenous Given 01/21/20 1257)    ED Course  I have reviewed the triage vital signs and the nursing notes.  Pertinent labs & imaging results that were available during my care of the patient were reviewed by me and considered in my medical decision making (see chart for details).    MDM Rules/Calculators/A&P                           Pt w/ RUQ tenderness on exam, VS reassuring. Lab work shows UA with trace leukocytes, 0-5 RBCs, 21-50 WBCs. Sent urine culture but UA is not highly suggestive of kidney stone or pyelonephritis. Blood work also notable for WBC 13.4, normal creatinine, normal LFTs and lipase. Because of tenderness on exam, obtained right upper quadrant ultrasound which was unremarkable.  After receiving above medications, patient able to tolerate liquids and is comfortable on reassessment. I discussed supportive measures for vomiting and diarrhea and extensively reviewed return precautions especially including  worsening pain, fever, bloody stools. He voiced understanding. Final Clinical Impression(s) / ED Diagnoses Final diagnoses:  Nausea vomiting and diarrhea  Upper abdominal pain    Rx / DC Orders ED Discharge Orders         Ordered    dicyclomine (BENTYL) 10 MG capsule  3 times daily PRN     Discontinue  Reprint     01/21/20 1355    ondansetron (ZOFRAN ODT) 4 MG disintegrating tablet  Every 8 hours PRN     Discontinue  Reprint     01/21/20 1355           Kynzli Rease, Ambrose [place], MD 01/21/20 1407

## 2020-01-21 NOTE — ED Notes (Signed)
Urine sent down to lab WITH culture 

## 2020-01-21 NOTE — ED Triage Notes (Signed)
Pt presents to ED BIB [facility]. Pt c/o R flank pain. Pt states that he was woken by n/v/d. Afterwards flank pain began. Intermittently worse. Hx of kidney stones

## 2020-01-22 LAB — URINE CULTURE: Culture: NO GROWTH

## 2020-11-04 ENCOUNTER — Other Ambulatory Visit: Payer: Self-pay | Admitting: Internal Medicine

## 2020-11-04 ENCOUNTER — Other Ambulatory Visit (HOSPITAL_COMMUNITY): Payer: Self-pay | Admitting: Internal Medicine

## 2020-11-04 DIAGNOSIS — R1031 Right lower quadrant pain: Secondary | ICD-10-CM

## 2021-01-03 ENCOUNTER — Other Ambulatory Visit: Payer: Self-pay

## 2021-01-03 ENCOUNTER — Ambulatory Visit
Admission: RE | Admit: 2021-01-03 | Discharge: 2021-01-03 | Disposition: A | Discharge status: Home / Self Care | Payer: BC Managed Care – PPO | Source: Ambulatory Visit | Attending: Internal Medicine | Admitting: Internal Medicine

## 2021-01-03 DIAGNOSIS — R1031 Right lower quadrant pain: Secondary | ICD-10-CM | POA: Insufficient documentation

## 2021-08-23 ENCOUNTER — Emergency Department (HOSPITAL_COMMUNITY)
Admission: EM | Admit: 2021-08-23 | Discharge: 2021-08-24 | Disposition: A | Discharge status: Home / Self Care | Payer: BC Managed Care – PPO | Attending: Emergency Medicine | Admitting: Emergency Medicine

## 2021-08-23 ENCOUNTER — Other Ambulatory Visit: Payer: Self-pay

## 2021-08-23 ENCOUNTER — Encounter (HOSPITAL_COMMUNITY): Payer: Self-pay | Admitting: Emergency Medicine

## 2021-08-23 DIAGNOSIS — M7989 Other specified soft tissue disorders: Secondary | ICD-10-CM

## 2021-08-23 DIAGNOSIS — M5441 Lumbago with sciatica, right side: Secondary | ICD-10-CM | POA: Insufficient documentation

## 2021-08-23 NOTE — ED Triage Notes (Signed)
Pt c/o mid back pain, right thigh tingling and right foot swelling, symptoms started Thursday. Denies injury/loss of bowel/bladder control.

## 2021-08-24 ENCOUNTER — Ambulatory Visit (HOSPITAL_COMMUNITY): Payer: Self-pay | Attending: Emergency Medicine

## 2021-08-24 MED ORDER — CELECOXIB 100 MG PO CAPS: 100.0000 mg | ORAL_CAPSULE | Freq: Two times a day (BID) | ORAL | 0 refills | Status: AC | PRN

## 2021-08-24 NOTE — ED Notes (Signed)
Pt brought back into ED,Hall [address], for Dr. Bebe Shaggy to examine, per Dr. Nelda Marseille request.

## 2021-08-24 NOTE — Discharge Instructions (Signed)
You have been seen here form back pain, given you an NSAID please take as prescribed, I recommend taking over-the-counter pain medications like Tylenol every 6 as needed.  Please follow dosage and on the back of bottle.  I also recommend applying heat to the area and stretching out the muscles as this will help decrease stiffness and pain.  I have given you information on exercises please follow.  Leg swelling-would like to come back for a DVT study please call the number above, if this is negative would like to follow-up your PCP for further evaluation of your leg swelling.  I recommend wearing compression socks, keep your legs elevated will not use.  Come back to the emergency department if you develop chest pain, shortness of breath, severe abdominal pain, uncontrolled nausea, vomiting, diarrhea.

## 2021-08-24 NOTE — Progress Notes (Signed)
Orthopedic Tech Progress Note Patient Details:  Tom [name] Jul 10, 1991 [id number]  Ortho Devices Type of Ortho Device: Ace wrap Ortho Device/Splint Location: bi lateral foot wrap toes to mid calf at drs request. Ortho Device/Splint Interventions: Ordered, Application, Adjustment   Post Interventions Patient Tolerated: Well Instructions Provided: Care of device, Adjustment of device  Karolee Stamps 08/24/2021, 3:08 AM

## 2021-08-24 NOTE — ED Notes (Signed)
E-signature pad unavailable at time of pt discharge. This RN discussed discharge materials with pt and answered all pt questions. Pt stated understanding of discharge material. ? ?

## 2021-08-24 NOTE — ED Notes (Signed)
Ace wraps applied bilaterally from pts toes to mid-calf.

## 2021-08-24 NOTE — ED Provider Notes (Signed)
[facility]   CSN: [id number] Arrival date & time: 08/23/21  1921     History  Chief Complaint  Patient presents with   Back Pain    Tom [name] is a 31 y.o. male.  HPI  Patient with medical history including Marfan's disease presents with chief complaint of back pain.  Patient states back pain started last Monday, pain came on after work, he states he has pain on his left upper neck and right lower back, states he will feel pain rating down to his left leg, and will have numbness on his right thigh from his knee to the mid thigh just on the anterior side.  He denies any weakness in his legs, denies saddle paresthesias no urinary problems, denies incontinency, retention, difficult bowel movements, denies IV drug use, no significant cardiac history.  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  Denies orthopnea or associated chest or shortness of breath.  Home Medications Prior to Admission medications   Medication Sig Start Date End Date Taking? Authorizing Provider  celecoxib (CELEBREX) 100 MG capsule Take 1 capsule (100 mg total) by mouth 2 (two) times daily as needed for up to 7 days for pain. 08/24/21 08/31/21 Yes Carroll SageFaulkner, Krisalyn Yankowski J, PA-C  dicyclomine (BENTYL) 10 MG capsule Take 1 capsule (10 mg total) by mouth 3 (three) times daily as needed for spasms. 01/21/20   Little, Ambrose Finlandachel Morgan, MD  ondansetron (ZOFRAN ODT) 4 MG disintegrating tablet Take 1 tablet (4 mg total) by mouth every 8 (eight) hours as needed for nausea or vomiting. 01/21/20   Little, Ambrose Finlandachel Morgan, MD      Allergies    Gabapentin    Review of Systems   Review of Systems   Constitutional:  Negative for chills and fever.  Respiratory:  Negative for shortness of breath.   Cardiovascular:  Positive for leg swelling. Negative for chest pain.  Gastrointestinal:  Negative for abdominal pain.  Musculoskeletal:  Positive for back pain.  Neurological:  Positive for numbness. Negative for seizures, weakness and headaches.   Physical Exam Updated Vital Signs BP (!) 172/128    Pulse 77    Temp 98 F (36.7 C) (Oral)    Resp 16    SpO2 99%  Physical Exam Vitals and nursing note reviewed.  Constitutional:      General: He is not in acute distress.    Appearance: He is not ill-appearing.  HENT:     Head: Normocephalic and atraumatic.     Nose: No congestion.  Eyes:     Conjunctiva/sclera: Conjunctivae normal.  Cardiovascular:     Rate and Rhythm: Normal rate and regular rhythm.     Pulses: Normal pulses.     Heart sounds: No murmur heard.   No friction rub. No gallop.  Pulmonary:     Effort: No respiratory distress.     Breath sounds: No wheezing, rhonchi or rales.  Musculoskeletal:     Comments: Spine was palpated was nontender palpation, no step-off deformities noted, he has noted pain along his paraspinal muscles, there is no overlying skin changes present.  He has full range of motion, 5 out of 5 strength neuro  vas intact in lower extremities.  Does have 1 pedal edema, no calf tenderness, no palpable cords, no unilateral leg swelling present.  Skin:    General: Skin is warm and dry.  Neurological:     Mental Status: He is alert.  Psychiatric:        Mood and Affect: Mood normal.    ED Results / Procedures / Treatments   Labs (all labs ordered are listed, but only abnormal results are displayed) Labs Reviewed - No data to display  EKG None  Radiology No results found.  Procedures Procedures    Medications Ordered in ED Medications - No data to display  ED Course/ Medical Decision Making/ A&P                           Medical Decision  Making  This patient presents to the ED for concern of back pain, this involves an extensive number of treatment options, and is a complaint that carries with it a high risk of complications and morbidity.  The differential diagnosis includes spina equina, UTI, kidney stone    Additional history obtained:  Additional history obtained from electronic medical record    Co morbidities that complicate the patient evaluation  Marfan's disease Social Determinants of Health:  N/A   Test Considered:  X-ray of lumbar spine, will defer as there is no traumatic injury associated this pain, very low suspicion for fracture at this time, he also has low risk factors for pathological fractures.    Rule out I have low suspicion for spinal fracture or spinal cord abnormality as patient denies urinary incontinency, retention, difficulty with bowel movements, denies saddle paresthesias.  Spine was palpated there is no step-off, crepitus or gross deformities felt, patient had 5/5 strength, full range of motion, neurovascular fully intact in the lower extremities. . Low suspicion for septic arthritis as patient denies IV drug use, skin exam was performed no erythematous, edema or warm joints noted.  Low suspicion for UTI, Pilo, kidney stone as he denies any urinary symptoms, has no CVA tenderness.  I have low suspicion for DVT as he has no calf tenderness there is no unilateral leg swelling, to have bilateral DVTs, likely this is lymphedema, poor hypertension, undiagnosed CHF.  Low suspicion for CHF exacerbation as lung sounds are clear bilaterally, denies orthopnea, vital signs reassuring nontachypneic, nonhypoxic.     Dispostion and problem list  After consideration of the diagnostic results and the patients response to treatment, I feel that the patent would benefit from   Back pain-likely this is secondary due to muscular strains, will provide him with muscle relaxers, anti-inflammatories  follow-up with PCP for further evaluation. Leg swelling-likely this is lymphedema versus undiagnosed CHF versus portal hypertension but possible he could have a DVT,  DVT study unavailable at this time  will schedule him for DVT tomorrow, will defer on anticoagulant as a very low suspicion of PE risks outweigh benefits..  If DVT study is negative we will have him follow-up with PCP for further work-up.            Final Clinical Impression(s) / ED Diagnoses Final diagnoses:  Acute right-sided low back pain with right-sided sciatica  Leg swelling    Rx / DC Orders ED Discharge Orders          Ordered    LE VENOUS        08/24/21 0235    celecoxib (CELEBREX) 100 MG  capsule  2 times daily PRN        08/24/21 0236              Carroll Sage, PA-C 08/24/21 0239    Zadie Rhine, MD 08/24/21 [phone]

## 2021-08-26 ENCOUNTER — Other Ambulatory Visit: Payer: Self-pay | Admitting: Internal Medicine

## 2021-08-26 DIAGNOSIS — M7989 Other specified soft tissue disorders: Secondary | ICD-10-CM

## 2021-08-28 ENCOUNTER — Ambulatory Visit
Admission: RE | Admit: 2021-08-28 | Discharge: 2021-08-28 | Disposition: A | Discharge status: Home / Self Care | Payer: Self-pay | Source: Ambulatory Visit | Attending: Internal Medicine | Admitting: Internal Medicine

## 2021-08-28 ENCOUNTER — Other Ambulatory Visit: Payer: Self-pay

## 2021-08-28 DIAGNOSIS — M7989 Other specified soft tissue disorders: Secondary | ICD-10-CM | POA: Insufficient documentation

## 2021-10-08 IMAGING — US US ABDOMEN LIMITED
1 series · 14 of 25 positions shown · non-contrast
Comparison: None.

CLINICAL DATA: Right upper quadrant pain.

EXAM:
ULTRASOUND ABDOMEN LIMITED RIGHT UPPER QUADRANT

[Series 1: us abdomen limited · 14 of 50 slices shown]
[im 1/50]
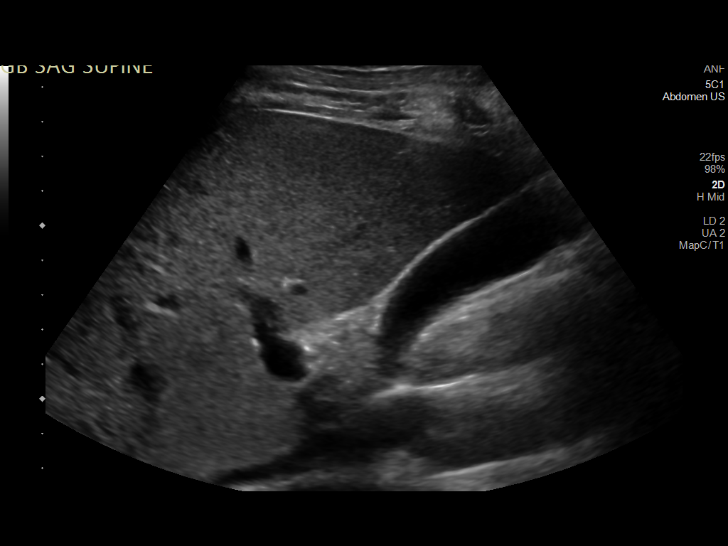
[im 5/50]
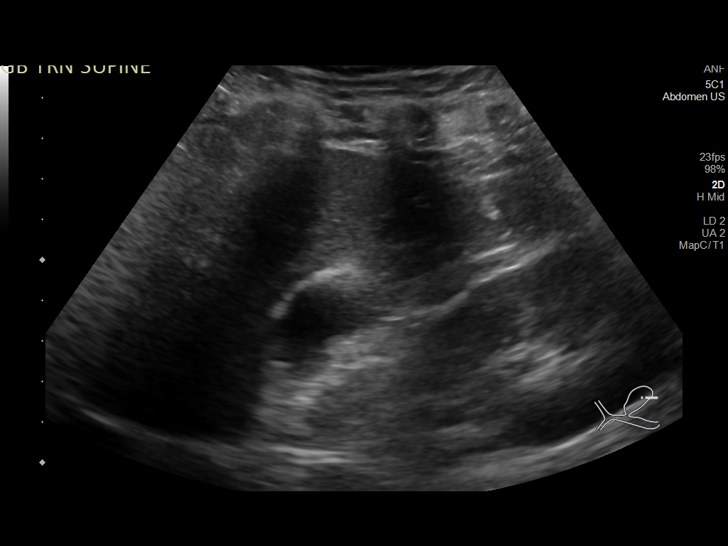
[im 9/50]
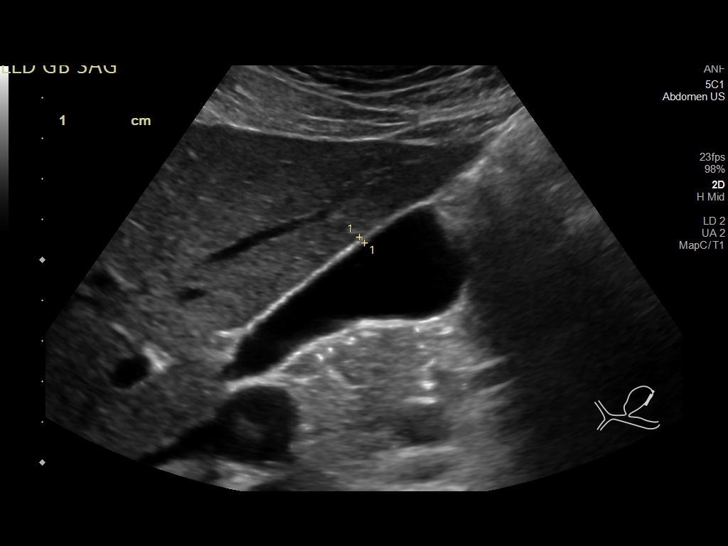
[im 13/50]
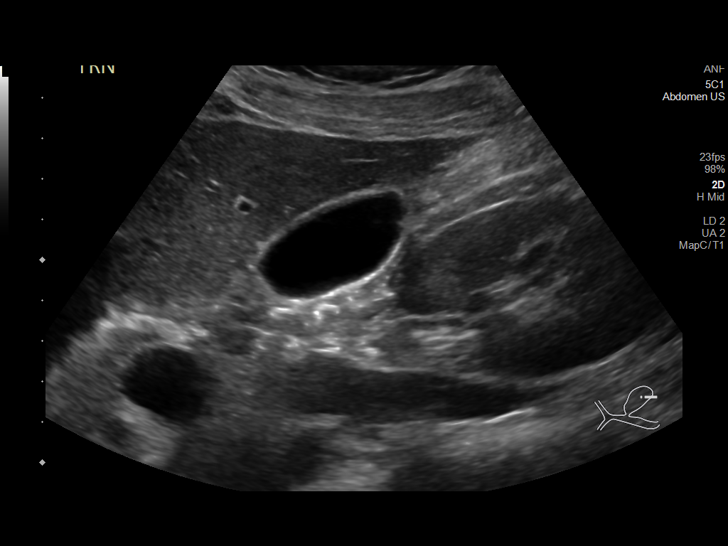
[im 17/50]
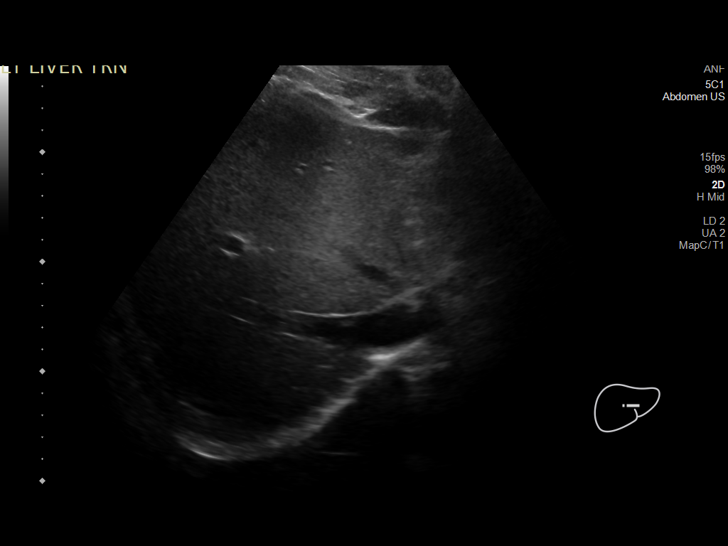
[im 19/50]
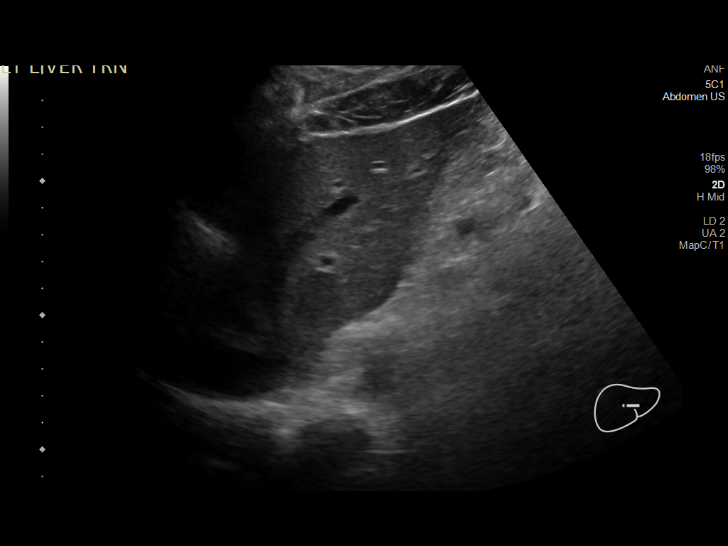
[im 23/50]
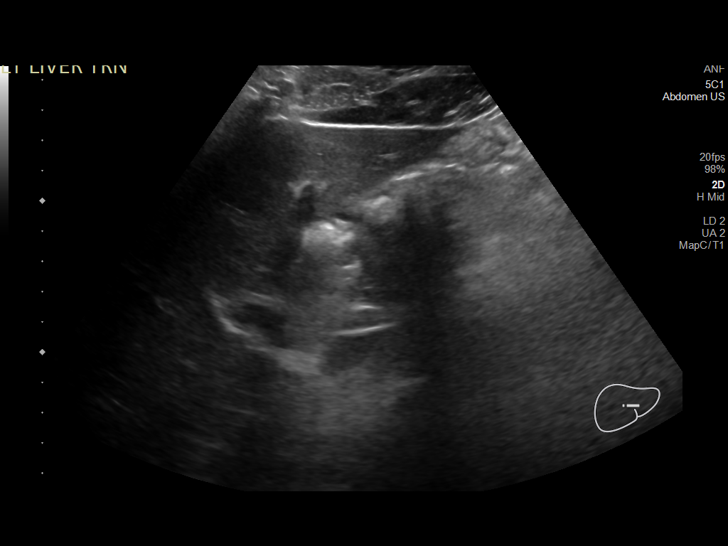
[im 27/50]
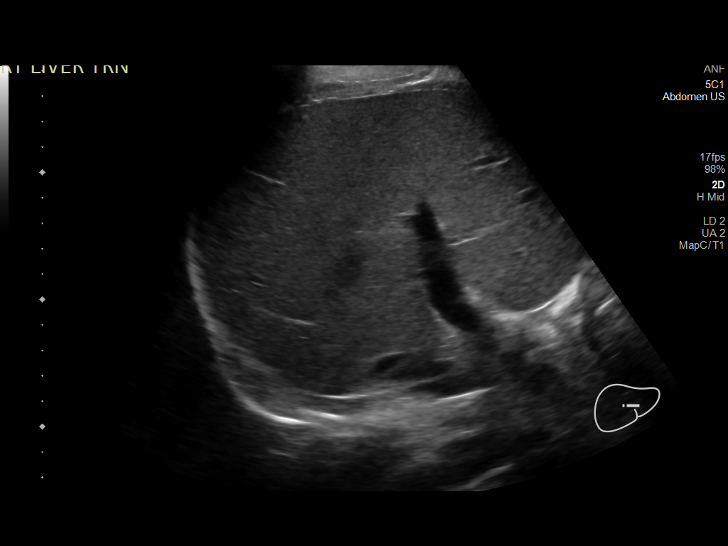
[im 31/50]
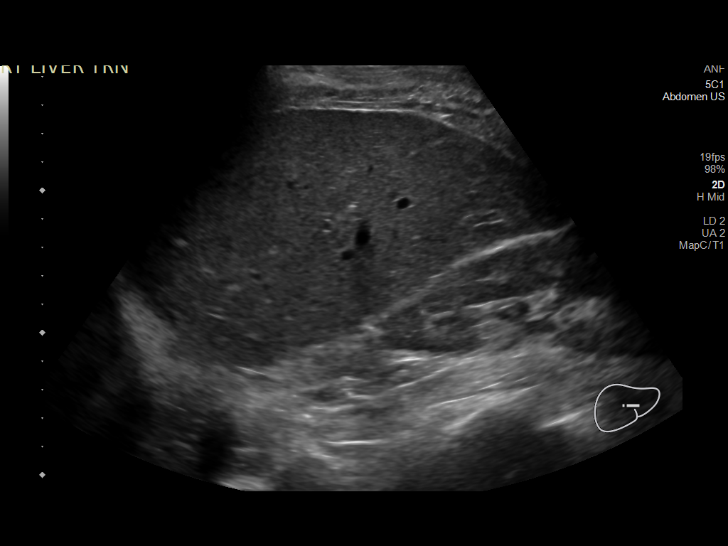
[im 33/50]
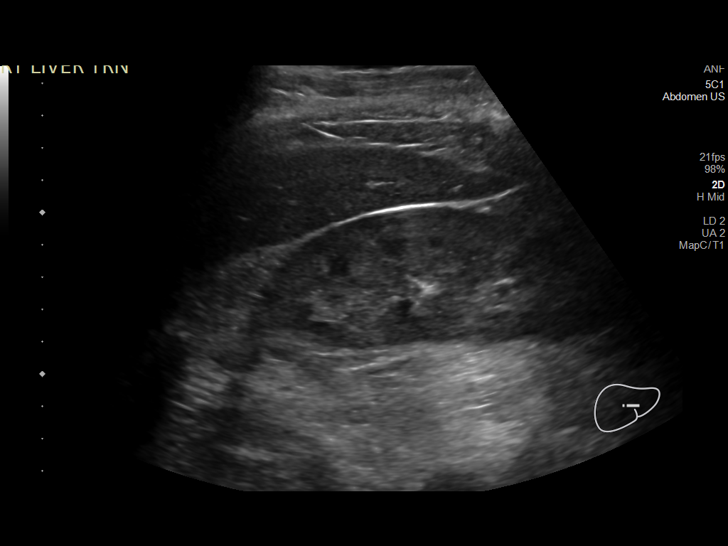
[im 37/50]
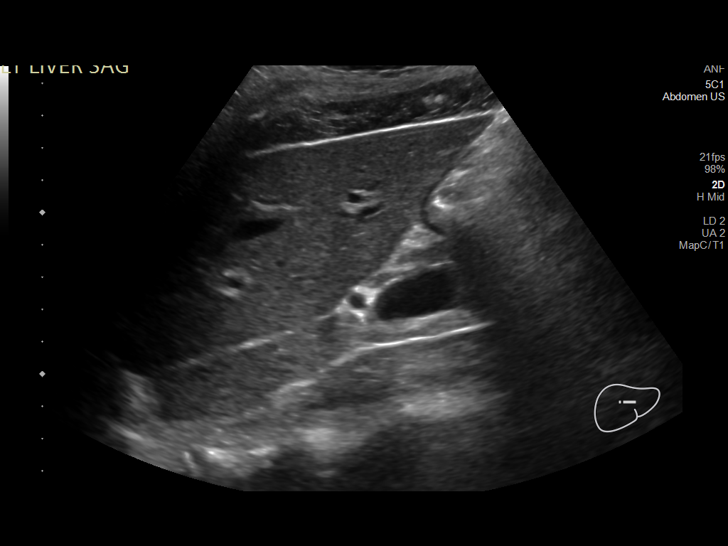
[im 41/50]
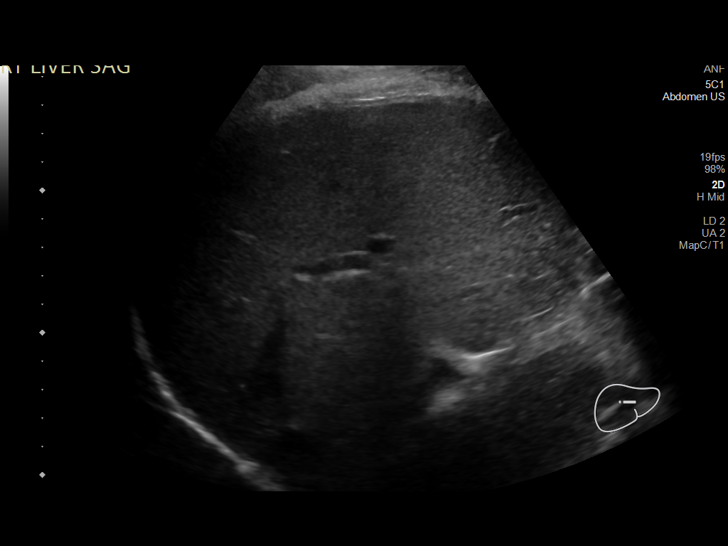
[im 45/50]
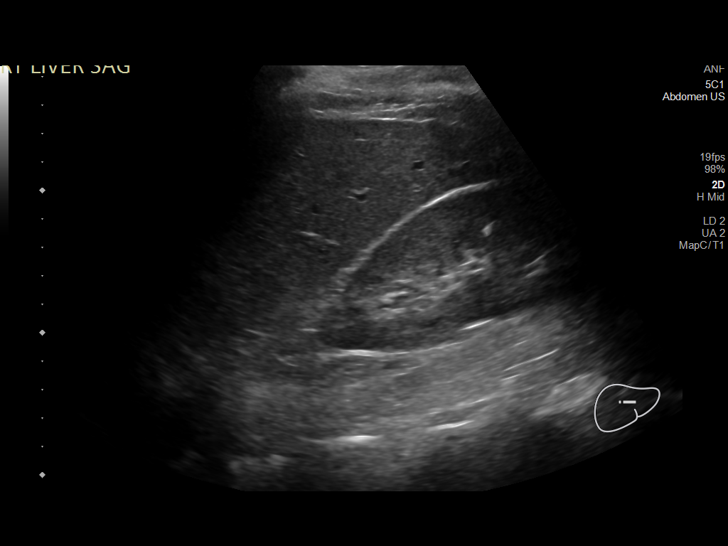
[im 50/50]
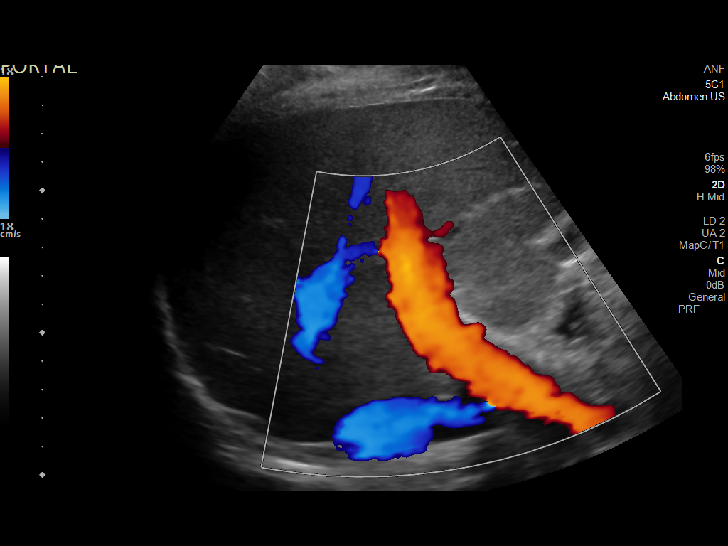

[14 of 25 positions shown; findings below may reference images not displayed]

FINDINGS: Gallbladder:

No gallstones or wall thickening visualized. No sonographic Murphy
sign noted by sonographer.

Common bile duct:

Diameter: 1.7 mm

Liver:

No focal lesion identified. Within normal limits in parenchymal
echogenicity. Portal vein is patent on color Doppler imaging with
normal direction of blood flow towards the liver.

Other: Multiple stones are visualized in the right kidney with the
largest measuring 3 x 6 x 4 mm. No hydronephrosis identified.
IMPRESSION: 1. Nonobstructive right renal stones.
2. No other abnormalities.

## 2022-03-23 DIAGNOSIS — R0789 Other chest pain: Secondary | ICD-10-CM | POA: Insufficient documentation

## 2022-03-24 DIAGNOSIS — I160 Hypertensive urgency: Secondary | ICD-10-CM | POA: Insufficient documentation

## 2022-03-24 DIAGNOSIS — R55 Syncope and collapse: Secondary | ICD-10-CM

## 2022-03-24 HISTORY — DX: Syncope and collapse: R55

## 2022-08-20 ENCOUNTER — Emergency Department (HOSPITAL_COMMUNITY): Payer: Self-pay

## 2022-08-20 ENCOUNTER — Emergency Department (HOSPITAL_COMMUNITY)
Admission: EM | Admit: 2022-08-20 | Discharge: 2022-08-20 | Disposition: A | Discharge status: Home / Self Care | Payer: Self-pay | Attending: Emergency Medicine | Admitting: Emergency Medicine

## 2022-08-20 ENCOUNTER — Other Ambulatory Visit: Payer: Self-pay

## 2022-08-20 ENCOUNTER — Encounter (HOSPITAL_COMMUNITY): Payer: Self-pay | Admitting: Emergency Medicine

## 2022-08-20 DIAGNOSIS — R079 Chest pain, unspecified: Secondary | ICD-10-CM | POA: Insufficient documentation

## 2022-08-20 HISTORY — DX: Marfan syndrome, unspecified: Q87.40

## 2022-08-20 LAB — CBC
HCT: 45 % (ref 39.0–52.0)
Hemoglobin: 15.5 g/dL (ref 13.0–17.0)
MCH: 29.6 pg (ref 26.0–34.0)
MCHC: 34.4 g/dL (ref 30.0–36.0)
MCV: 85.9 fL (ref 80.0–100.0)
Platelets: 218 10*3/uL (ref 150–400)
RBC: 5.24 MIL/uL (ref 4.22–5.81)
RDW: 11.3 % — ABNORMAL LOW (ref 11.5–15.5)
WBC: 6.9 10*3/uL (ref 4.0–10.5)
nRBC: 0 % (ref 0.0–0.2)

## 2022-08-20 LAB — LIPASE, BLOOD: Lipase: 41 U/L (ref 11–51)

## 2022-08-20 LAB — TROPONIN I (HIGH SENSITIVITY)
Troponin I (High Sensitivity): <2 ng/L (ref <18)
Troponin I (High Sensitivity): <2 ng/L (ref <18)

## 2022-08-20 LAB — BASIC METABOLIC PANEL
Anion gap: 8 (ref 5–15)
BUN: 21 mg/dL — ABNORMAL HIGH (ref 6–20)
CO2: 26 mmol/L (ref 22–32)
Calcium: 9.7 mg/dL (ref 8.9–10.3)
Chloride: 102 mmol/L (ref 98–111)
Creatinine, Ser: 0.91 mg/dL (ref 0.61–1.24)
GFR, Estimated: >60 mL/min (ref >60)
Glucose, Bld: 89 mg/dL (ref 70–99)
Potassium: 4.1 mmol/L (ref 3.5–5.1)
Sodium: 136 mmol/L (ref 135–145)

## 2022-08-20 LAB — HEPATIC FUNCTION PANEL
ALT: 31 U/L (ref 0–44)
AST: 25 U/L (ref 15–41)
Albumin: 4.4 g/dL (ref 3.5–5.0)
Alkaline Phosphatase: 51 U/L (ref 38–126)
Bilirubin, Direct: <0.1 mg/dL (ref 0.0–0.2)
Total Bilirubin: 0.4 mg/dL (ref 0.3–1.2)
Total Protein: 7.9 g/dL (ref 6.5–8.1)

## 2022-08-20 MED ORDER — OXYCODONE-ACETAMINOPHEN 5-325 MG PO TABS: 1.0000 | ORAL_TABLET | Freq: Once | ORAL | Status: DC

## 2022-08-20 MED ORDER — KETOROLAC TROMETHAMINE 15 MG/ML IJ SOLN
15.0000 mg | Freq: Once | INTRAMUSCULAR | Status: AC
  Administered 2022-08-20: 15 mg via INTRAVENOUS
  Filled 2022-08-20: qty 1

## 2022-08-20 MED ORDER — IOHEXOL 350 MG/ML SOLN
100.0000 mL | Freq: Once | INTRAVENOUS | Status: AC | PRN
  Administered 2022-08-20: 100 mL via INTRAVENOUS

## 2022-08-20 MED ORDER — FENTANYL CITRATE PF 50 MCG/ML IJ SOSY
50.0000 ug | PREFILLED_SYRINGE | Freq: Once | INTRAMUSCULAR | Status: AC
  Administered 2022-08-20: 50 ug via INTRAVENOUS
  Filled 2022-08-20: qty 1

## 2022-08-20 MED ORDER — OXYCODONE-ACETAMINOPHEN 5-325 MG PO TABS
2.0000 | ORAL_TABLET | Freq: Once | ORAL | Status: AC
  Administered 2022-08-20: 2 via ORAL
  Filled 2022-08-20: qty 2

## 2022-08-20 MED ORDER — ONDANSETRON HCL 4 MG/2ML IJ SOLN
4.0000 mg | Freq: Once | INTRAMUSCULAR | Status: AC
  Administered 2022-08-20: 4 mg via INTRAVENOUS
  Filled 2022-08-20: qty 2

## 2022-08-20 NOTE — ED Provider Notes (Signed)
Tom [name]   CSN: [id number] Arrival date & time: 08/20/22  0272     History  Chief Complaint  Patient presents with   Chest Pain    Tom [name] is a 32 y.o. male.  Patient here with chest pain.  History of Marfan syndrome.  Known mild aneurysm of the ascending aorta.  He has intermittent pain in his chest and back at times.  Nothing makes it worse or better.  Denies any nausea vomiting or diarrhea or abdominal pain.  He is not having any major current pain at this time.  History of substance abuse in the past he states.  Denies any weakness or numbness or chills.  No infectious symptoms.  The history is provided by the patient.       Home Medications Prior to Admission medications   Medication Sig Start Date End Date Taking? Authorizing Provider  dicyclomine (BENTYL) 10 MG capsule Take 1 capsule (10 mg total) by mouth 3 (three) times daily as needed for spasms. 01/21/20   Little, Wenda Overland, MD  ondansetron (ZOFRAN ODT) 4 MG disintegrating tablet Take 1 tablet (4 mg total) by mouth every 8 (eight) hours as needed for nausea or vomiting. 01/21/20   Little, Wenda Overland, MD      Allergies    Gabapentin    Review of Systems   Review of Systems  Physical Exam Updated Vital Signs BP (!) 126/90   Pulse 61   Temp 97.9 F (36.6 C)   Resp 16   SpO2 100%  Physical Exam Vitals and nursing [name] reviewed.  Constitutional:      General: He is not in acute distress.    Appearance: He is well-developed. He is not ill-appearing.  HENT:     Head: Normocephalic and atraumatic.  Eyes:     Extraocular Movements: Extraocular movements intact.     Conjunctiva/sclera: Conjunctivae normal.     Pupils: Pupils are equal, round, and reactive to light.  Cardiovascular:     Rate and Rhythm: Normal rate and regular rhythm.     Pulses:          Radial pulses are 2+ on the right side and 2+ on the left side.     Heart sounds:  Normal heart sounds. No murmur heard. Pulmonary:     Effort: Pulmonary effort is normal. No respiratory distress.     Breath sounds: Normal breath sounds. No decreased breath sounds.  Abdominal:     Palpations: Abdomen is soft.     Tenderness: There is no abdominal tenderness.  Musculoskeletal:        General: No swelling.     Cervical back: Normal range of motion and neck supple.  Skin:    General: Skin is warm and dry.     Capillary Refill: Capillary refill takes less than 2 seconds.  Neurological:     General: No focal deficit present.     Mental Status: He is alert.  Psychiatric:        Mood and Affect: Mood normal.     ED Results / Procedures / Treatments   Labs (all labs ordered are listed, but only abnormal results are displayed) Labs Reviewed  BASIC METABOLIC PANEL - Abnormal; Notable for the following components:      Result Value   BUN 21 (*)    All other components within normal limits  CBC - Abnormal; Notable for the following components:   RDW 11.3 (*)  All other components within normal limits  LIPASE, BLOOD  HEPATIC FUNCTION PANEL  TROPONIN I (HIGH SENSITIVITY)  TROPONIN I (HIGH SENSITIVITY)    EKG EKG Interpretation  Date/Time:  Thursday August 20 2022 02:27:28 EST Ventricular Rate:  60 PR Interval:  164 QRS Duration: 100 QT Interval:  380 QTC Calculation: 380 R Axis:   160 Text Interpretation: Normal sinus rhythm PREVIOUS ECG IS PRESENT Confirmed by Virgina Norfolk ([id number]) on 08/20/2022 9:07:34 AM  Radiology CT Angio Chest/Abd/Pel for Dissection W and/or W/WO  Result Date: 08/20/2022 CLINICAL DATA:  Acute aortic syndrome suspected. History of Marfan's disease with aortic dilatation. EXAM: CT ANGIOGRAPHY CHEST, ABDOMEN AND PELVIS TECHNIQUE: Non-contrast CT of the chest was initially obtained. Multidetector CT imaging through the chest, abdomen and pelvis was performed using the standard protocol during bolus administration of intravenous contrast.  Multiplanar reconstructed images and MIPs were obtained and reviewed to evaluate the vascular anatomy. RADIATION DOSE REDUCTION: This exam was performed according to the departmental dose-optimization program which includes automated exposure control, adjustment of the mA and/or kV according to patient size and/or use of iterative reconstruction technique. CONTRAST:  OMNIPAQUE IOHEXOL 350 MG/ML SOLN COMPARISON:  05/25/2013. FINDINGS: CTA CHEST FINDINGS Cardiovascular: The heart is normal in size and there is no pericardial effusion. There is mild aneurysmal dilatation of the ascending aorta measuring 4.1 cm. No dissection is seen. The pulmonary trunk is mildly distended suggesting underlying pulmonary artery hypertension. Mediastinum/Nodes: No enlarged mediastinal, hilar, or axillary lymph nodes. Thyroid gland, trachea, and esophagus demonstrate no significant findings. Lungs/Pleura: Lungs are clear. There is a 6 mm nodule in the right lower lobe, axial image 84, unchanged from 2017 and likely benign. No pleural effusion or pneumothorax. Musculoskeletal: Mild gynecomastia is noted bilaterally. No acute or suspicious osseous abnormality. Review of the MIP images confirms the above findings. CTA ABDOMEN AND PELVIS FINDINGS VASCULAR Aorta: Normal caliber aorta without aneurysm, dissection, vasculitis or significant stenosis. Celiac: Patent without evidence of aneurysm, dissection, vasculitis or significant stenosis. SMA: Patent without evidence of aneurysm, dissection, vasculitis or significant stenosis. Renals: Both renal arteries are patent without evidence of aneurysm, dissection, vasculitis, fibromuscular dysplasia or significant stenosis. IMA: Patent without evidence of aneurysm, dissection, vasculitis or significant stenosis. Inflow: Patent without evidence of aneurysm, dissection, vasculitis or significant stenosis. Veins: No obvious venous abnormality within the limitations of this arterial phase study.  Review of the MIP images confirms the above findings. NON-VASCULAR Hepatobiliary: No focal liver abnormality is seen. No gallstones, gallbladder wall thickening, or biliary dilatation. Pancreas: Unremarkable. No pancreatic ductal dilatation or surrounding inflammatory changes. Spleen: Normal in size without focal abnormality. Adrenals/Urinary Tract: The adrenal glands are within normal limits. Kidneys enhance symmetrically. Renal calculi are present bilaterally. The bladder is unremarkable. Stomach/Bowel: Stomach is within normal limits. Appendix appears normal. No evidence of bowel wall thickening, distention, or inflammatory changes. No free air or pneumatosis. Lymphatic: No abdominal or pelvic lymphadenopathy. Reproductive: Prostate is unremarkable. Other: No abdominopelvic ascites. Musculoskeletal: No acute or suspicious osseous abnormality. Review of the MIP images confirms the above findings. IMPRESSION: 1. No evidence of aortic dissection. There is mild aneurysmal dilatation of the ascending aorta measuring 4.1 cm. Recommend annual imaging followup by CTA or MRA. This recommendation follows 2010 ACCF/AHA/AATS/ACR/ASA/SCA/SCAI/SIR/STS/SVM Guidelines for the Diagnosis and Management of Patients with Thoracic Aortic Disease. Circulation. 2010; 121: S283-T517. Aortic aneurysm NOS (ICD10-I71.9) 2. No acute process in the chest, abdomen, or pelvis. 3. Bilateral nephrolithiasis. Electronically Signed   By: Charlestine Night.D.  On: 08/20/2022 04:51   DG Chest 2 View  Result Date: 08/20/2022 CLINICAL DATA:  Chest pain EXAM: CHEST - 2 VIEW COMPARISON:  08/10/2022 FINDINGS: The heart size and mediastinal contours are within normal limits. Both lungs are clear. The visualized skeletal structures are unremarkable. IMPRESSION: No active cardiopulmonary disease. Electronically Signed   By: Inez Catalina M.D.   On: 08/20/2022 03:04    Procedures Procedures    Medications Ordered in ED Medications  fentaNYL  (SUBLIMAZE) injection 50 mcg (50 mcg Intravenous Given 08/20/22 0245)  ondansetron (ZOFRAN) injection 4 mg (4 mg Intravenous Given 08/20/22 0245)  iohexol (OMNIPAQUE) 350 MG/ML injection 100 mL (100 mLs Intravenous Contrast Given 08/20/22 0434)  oxyCODONE-acetaminophen (PERCOCET/ROXICET) 5-325 MG per tablet 2 tablet (2 tablets Oral Given 08/20/22 0924)  ketorolac (TORADOL) 15 MG/ML injection 15 mg (15 mg Intravenous Given 08/20/22 0626)    ED Course/ Medical Decision Making/ A&P                             Medical Decision Making Risk Prescription drug management.   Ermal Haberer is here with chest pain.  Normal vitals.  No fever.  History of Marfan syndrome.  Has history of mild aneurysm of ascending aorta.  Overall he appears well.  He is neurovascular neuromuscular intact.  He is already had workup done prior to my evaluation including troponins, CT dissection study.  He had a CT dissection study done about 4 5 months ago with aneurysm around 4.0 cm.  Today's measurement may be 4.1 cm.  There is no dissection.  He is well-appearing.  Troponin is negative x 2.  No evidence of pneumonia or pneumothorax otherwise.  No significant anemia or electrolyte abnormality per my review and interpretation of labs.  EKG shows sinus rhythm.  No ischemic changes.  My suspicion is that this is more likely muscular.  He does admit to some anxiety about this.  I am not sure if he follows with the CT surgeon so we will give him referral so that they can follow this along.  It does appear that he has some follow-up with the doctor tomorrow about things but he is not sure exactly who that is.  Patient felt better after pain medication given here.  Discharged in good condition.  Understands return precautions.  This chart was dictated using voice recognition software.  Despite best efforts to proofread,  errors can occur which can change the documentation meaning.         Final Clinical Impression(s) / ED  Diagnoses Final diagnoses:  Nonspecific chest pain    Rx / DC Orders ED Discharge Orders     None         Lennice Sites, DO 08/20/22 [id number]

## 2022-08-20 NOTE — ED Provider Triage Note (Signed)
Emergency Medicine Provider Triage Evaluation Note  Tom [name] , a 32 y.o. male  was evaluated in triage.  Pt complains of chest pain. Hx of marfan's  Had a workup at [facility] - they suspect he has marfan's. This [facility] workup was in August. No dissection then but did have a little dilated aorta (slightly)  Constant pain for 1 week. Went to [facility] but left after initial workup the 8th.    Review of Systems  Positive: CP Negative: Fever   Physical Exam  BP 121/74   Pulse (!) 59   Temp 98.2 F (36.8 C)   Resp 18   SpO2 95%  Gen:   Awake, no distress   Resp:  Normal effort  MSK:   Moves extremities without difficulty  Other:   Medical Decision Making  Medically screening exam initiated at 2:29 AM.  Appropriate orders placed.  Tom [name] was informed that the remainder of the evaluation will be completed by another provider, this initial triage assessment does not replace that evaluation, and the importance of remaining in the ED until their evaluation is complete.  Workup initiating    Tedd Sias, [place] 08/20/22 [phone]

## 2022-08-20 NOTE — Discharge Instructions (Addendum)
There is mild aneurysmal  dilatation of the ascending aorta measuring 4.1 cm. Follow up CT surgery.  It appears that your CT scan a few months ago showed that this was 4.0 cm.  Overall continue to take Tylenol and ibuprofen for any pain.  Follow-up with CT surgery.

## 2022-08-20 NOTE — ED Triage Notes (Signed)
Patient arrived with EMS from work reports central chest pain this evening , no SOB , denies emesis or diaphoresis . He took his own ASA prior to arrival .

## 2022-08-21 DIAGNOSIS — I7121 Aneurysm of the ascending aorta, without rupture: Secondary | ICD-10-CM | POA: Insufficient documentation

## 2023-03-05 DIAGNOSIS — I10 Essential (primary) hypertension: Secondary | ICD-10-CM | POA: Insufficient documentation

## 2023-03-10 ENCOUNTER — Ambulatory Visit: Payer: BC Managed Care – PPO | Admitting: Podiatry

## 2023-03-18 ENCOUNTER — Ambulatory Visit (INDEPENDENT_AMBULATORY_CARE_PROVIDER_SITE_OTHER): Payer: BC Managed Care – PPO

## 2023-03-18 ENCOUNTER — Ambulatory Visit (INDEPENDENT_AMBULATORY_CARE_PROVIDER_SITE_OTHER): Payer: BC Managed Care – PPO | Admitting: Podiatry

## 2023-03-18 ENCOUNTER — Encounter: Payer: Self-pay | Admitting: Podiatry

## 2023-03-18 ENCOUNTER — Other Ambulatory Visit: Payer: Self-pay | Admitting: Podiatry

## 2023-03-18 DIAGNOSIS — M778 Other enthesopathies, not elsewhere classified: Secondary | ICD-10-CM

## 2023-03-18 DIAGNOSIS — L600 Ingrowing nail: Secondary | ICD-10-CM

## 2023-03-18 DIAGNOSIS — M205X1 Other deformities of toe(s) (acquired), right foot: Secondary | ICD-10-CM | POA: Diagnosis not present

## 2023-03-18 DIAGNOSIS — F988 Other specified behavioral and emotional disorders with onset usually occurring in childhood and adolescence: Secondary | ICD-10-CM | POA: Insufficient documentation

## 2023-03-18 MED ORDER — DOXYCYCLINE HYCLATE 100 MG PO TABS: 100.0000 mg | ORAL_TABLET | Freq: Two times a day (BID) | ORAL | 1 refills | Status: DC

## 2023-03-18 NOTE — Progress Notes (Signed)
Subjective:   Patient ID: Tom [name], male   DOB: 32 y.o.   MRN: [id number]   HPI Patient presents with painful ingrown toenail deformity right big toe and pain in the toe joint right over left that at times is very sharp.  Patient does have Marfan's disease and does not smoke tries to be active   Review of Systems  All other systems reviewed and are negative.       Objective:  Physical Exam Vitals and nursing note reviewed.  Constitutional:      Appearance: He is well-developed.  Pulmonary:     Effort: Pulmonary effort is normal.  Musculoskeletal:        General: Normal range of motion.  Skin:    General: Skin is warm.  Neurological:     Mental Status: He is alert.     Neurovascular status intact muscle strength adequate range of motion adequate reduced range of motion of the first MPJ right over left with incurvated lateral border right hallux very tender and pain within the joint surface right over left foot.  Good digital perfusion well-oriented     Assessment:  Hallux limitus deformity right over left with probable elevation of the metatarsal segment along with ingrown toenail deformity right over left foot     Plan:  H&P reviewed discussed all conditions recommended at this point we focus on the nail but long-term will probably need to focus on the joint surface.  I did advise on surgical correction of nail explained procedure risk patient wants surgery and I explained this to the patient and patient at this time have a right hallux infiltrated 60 mg like Marcaine mixture sterile prep done using sterile instrumentation remove the border exposed matrix applied phenol 3 applications 30 seconds followed by alcohol by sterile dressing.  Discussed the hallux limitus deformity which may need to be addressed at 1 point and patient will be seen back in approximate 2 months  X-rays indicate significant elevation first metatarsal segment beginning of spurs and some squaring off  of the joint surface

## 2023-03-18 NOTE — Patient Instructions (Signed)

## 2023-04-08 ENCOUNTER — Emergency Department (HOSPITAL_COMMUNITY): Payer: BC Managed Care – PPO

## 2023-04-08 ENCOUNTER — Other Ambulatory Visit: Payer: Self-pay

## 2023-04-08 ENCOUNTER — Encounter (HOSPITAL_COMMUNITY): Payer: Self-pay

## 2023-04-08 ENCOUNTER — Emergency Department (HOSPITAL_COMMUNITY)
Admission: EM | Admit: 2023-04-08 | Discharge: 2023-04-08 | Disposition: A | Discharge status: Home / Self Care | Payer: BC Managed Care – PPO | Attending: Emergency Medicine | Admitting: Emergency Medicine

## 2023-04-08 DIAGNOSIS — R079 Chest pain, unspecified: Secondary | ICD-10-CM | POA: Insufficient documentation

## 2023-04-08 DIAGNOSIS — Z87891 Personal history of nicotine dependence: Secondary | ICD-10-CM | POA: Diagnosis not present

## 2023-04-08 LAB — CBC
HCT: 38.4 % — ABNORMAL LOW (ref 39.0–52.0)
Hemoglobin: 13.2 g/dL (ref 13.0–17.0)
MCH: 28.9 pg (ref 26.0–34.0)
MCHC: 34.4 g/dL (ref 30.0–36.0)
MCV: 84 fL (ref 80.0–100.0)
Platelets: 227 10*3/uL (ref 150–400)
RBC: 4.57 MIL/uL (ref 4.22–5.81)
RDW: 11.7 % (ref 11.5–15.5)
WBC: 7.3 10*3/uL (ref 4.0–10.5)
nRBC: 0 % (ref 0.0–0.2)

## 2023-04-08 LAB — BASIC METABOLIC PANEL
Anion gap: 11 (ref 5–15)
BUN: 9 mg/dL (ref 6–20)
CO2: 23 mmol/L (ref 22–32)
Calcium: 9.1 mg/dL (ref 8.9–10.3)
Chloride: 104 mmol/L (ref 98–111)
Creatinine, Ser: 0.97 mg/dL (ref 0.61–1.24)
GFR, Estimated: >60 mL/min (ref >60)
Glucose, Bld: 89 mg/dL (ref 70–99)
Potassium: 3.6 mmol/L (ref 3.5–5.1)
Sodium: 138 mmol/L (ref 135–145)

## 2023-04-08 LAB — TROPONIN I (HIGH SENSITIVITY)
Troponin I (High Sensitivity): 3 ng/L (ref <18)
Troponin I (High Sensitivity): 3 ng/L (ref <18)

## 2023-04-08 MED ORDER — IOHEXOL 350 MG/ML SOLN
100.0000 mL | Freq: Once | INTRAVENOUS | Status: AC | PRN
  Administered 2023-04-08: 100 mL via INTRAVENOUS

## 2023-04-08 MED ORDER — OXYCODONE HCL 5 MG PO TABS: 5.0000 mg | ORAL_TABLET | Freq: Once | ORAL | Status: DC

## 2023-04-08 MED ORDER — ACETAMINOPHEN 500 MG PO TABS
1000.0000 mg | ORAL_TABLET | Freq: Once | ORAL | Status: AC
  Administered 2023-04-08: 1000 mg via ORAL
  Filled 2023-04-08: qty 2

## 2023-04-08 NOTE — ED Provider Notes (Signed)
[facility]ENC[facility] DEPT [facility] Emergency Department Provider Note MRN:  [id number]  Arrival date & time: 04/08/23     Chief Complaint   Chest Pain   History of Present Illness   Tom [name] is a 32 y.o. year-old male with a history of Marfan syndrome presenting to the ED with chief complaint of chest pain.  Central chest pain with radiation to the left flank region started at work, initially moderate to severe, now 3 out of 10.  Review of Systems  A thorough review of systems was obtained and all systems are negative except as noted in the HPI and PMH.   Patient's Health History    Past Medical History:  Diagnosis Date   Kidney calculi    Kidney stones    Marfan syndrome     Past Surgical History:  Procedure Laterality Date   TYMPANOSTOMY TUBE PLACEMENT     multiple times as a child    Family History  Problem Relation Age of Onset   Hypertension Father    Marfan syndrome Maternal Grandfather    Cancer Mother        skin   Pectus excavatum Mother     Social History   Socioeconomic History   Marital status: Married    Spouse name: Not on file   Number of children: Not on file   Years of education: Not on file   Highest education level: Not on file  Occupational History   Occupation: [employer]  Tobacco Use   Smoking status: Former   Smokeless tobacco: Never  Substance and Sexual Activity   Alcohol use: Yes    Alcohol/week: 0.0 standard drinks of alcohol    Comment: rarely   Drug use: Never   Sexual activity: Not on file  Other Topics Concern   Not on file  Social History Narrative   Lives at home with brother in law and wife.     Social Determinants of Health   Financial Resource Strain: Not on file  Food Insecurity: Not on file  Transportation Needs: Not on file  Physical Activity: Not on file  Stress: Stress Concern Present (03/23/2022)   Received from [facility], [facility]   [employer] [facility] -  Occupational Stress Questionnaire    Feeling of Stress : Very much  Social Connections: Unknown (12/05/2021)   Received from [facility], [facility]   Social Network    Social Network: Not on file  Intimate Partner Violence: Unknown (11/03/2021)   Received from [facility], [facility]   HITS    Physically Hurt: Not on file    Insult or Talk Down To: Not on file    Threaten Physical Harm: Not on file    Scream or Curse: Not on file     Physical Exam   Vitals:   04/08/23 0630 04/08/23 0645  BP: (!) 148/97 (!) 149/98  Pulse: 68 70  Resp: 10 18  Temp:    SpO2: 97% 98%    CONSTITUTIONAL: Well-appearing, NAD NEURO/PSYCH:  Alert and oriented x 3, no focal deficits EYES:  eyes equal and reactive ENT/NECK:  no LAD, no JVD CARDIO: Regular rate, well-perfused, normal S1 and S2 PULM:  CTAB no wheezing or rhonchi GI/GU:  non-distended, non-tender MSK/SPINE:  No gross deformities, no edema SKIN:  no rash, atraumatic   *Additional and/or pertinent findings included in MDM below  Diagnostic and Interventional Summary    EKG Interpretation Date/Time:  Thursday April 08 2023 04:06:04 EDT  Ventricular Rate:  73 PR Interval:  170 QRS Duration:  103 QT Interval:  380 QTC Calculation: 419 R Axis:   62  Text Interpretation: Sinus rhythm ST elev, probable normal early repol pattern Confirmed by Kennis Carina [id number]) on 04/08/2023 6:47:40 AM       Labs Reviewed  CBC - Abnormal; Notable for the following components:      Result Value   HCT 38.4 (*)    All other components within normal limits  BASIC METABOLIC PANEL  TROPONIN I (HIGH SENSITIVITY)  TROPONIN I (HIGH SENSITIVITY)    CT Angio Chest/Abd/Pel for Dissection W and/or Wo Contrast  Final Result    DG Chest 2 View  Final Result      Medications  acetaminophen (TYLENOL) tablet 1,000 mg (1,000 mg Oral Given 04/08/23 0433)  iohexol (OMNIPAQUE) 350 MG/ML injection 100 mL (100 mLs Intravenous Contrast Given 04/08/23  0529)     Procedures  /  Critical Care Procedures  ED Course and Medical Decision Making  Initial Impression and Ddx Given history of Marfan syndrome there is concern for aortic dissection.  Also considering ACS, doubt PE.  Does a lot of lifting at work, could simply be MSK.  Past medical/surgical history that increases complexity of ED encounter: Marfan's  Interpretation of Diagnostics I personally reviewed the EKG and my interpretation is as follows: Sinus rhythm  Labs reassuring with no significant blood count or electrolyte disturbance.  Troponin negative x 2.  Patient Reassessment and Ultimate Disposition/Management     CTA is without dissection, due to aortic aneurysm is slightly increased from prior.  Patient's pain is under control, vitals normal, overall doubt cardiopulmonary etiology.  Appropriate for discharge.  Patient management required discussion with the following services or consulting groups:  None  Complexity of Problems Addressed Acute illness or injury that poses threat of life of bodily function  Additional Data Reviewed and Analyzed Further history obtained from: Prior labs/imaging results  Additional Factors Impacting ED Encounter Risk Consideration of hospitalization  Elmer Sow. Pilar Plate, MD [facility] Emergency Medicine [facility] [email or web address]  Final Clinical Impressions(s) / ED Diagnoses     ICD-10-CM   1. Chest pain, unspecified type  R07.9       ED Discharge Orders     None        Discharge Instructions Discussed with and Provided to Patient:     Discharge Instructions      You were evaluated in the Emergency Department and after careful evaluation, we did not find any emergent condition requiring admission or further testing in the hospital.  Your exam/testing today is overall reassuring.  Recommend follow-up with your cardiothoracic surgeon, regular doctors.  Use your home medications for pain.  Please  return to the Emergency Department if you experience any worsening of your condition.   Thank you for allowing [place] to be a part of your care.       Sabas Sous, MD 04/08/23 [phone]

## 2023-04-08 NOTE — ED Triage Notes (Signed)
Patient BIB [facility] from work with complaint chest pain, dizziness, SOB & nausea starting at 2230  20g RAC  1 SL nitroglycerin

## 2023-04-08 NOTE — Discharge Instructions (Signed)
You were evaluated in the Emergency Department and after careful evaluation, we did not find any emergent condition requiring admission or further testing in the hospital.  Your exam/testing today is overall reassuring.  Recommend follow-up with your cardiothoracic surgeon, regular doctors.  Use your home medications for pain.  Please return to the Emergency Department if you experience any worsening of your condition.   Thank you for allowing [place] to be a part of your care.

## 2023-04-08 NOTE — ED Notes (Signed)
Patient reports that pain improved when EMS administered 1 SL nitroglycerin.

## 2023-05-13 ENCOUNTER — Encounter: Payer: Self-pay | Admitting: Podiatry

## 2023-05-13 ENCOUNTER — Ambulatory Visit: Payer: BC Managed Care – PPO

## 2023-05-13 ENCOUNTER — Ambulatory Visit: Payer: BC Managed Care – PPO | Admitting: Podiatry

## 2023-05-13 DIAGNOSIS — M205X1 Other deformities of toe(s) (acquired), right foot: Secondary | ICD-10-CM

## 2023-05-13 DIAGNOSIS — M778 Other enthesopathies, not elsewhere classified: Secondary | ICD-10-CM

## 2023-05-13 DIAGNOSIS — L600 Ingrowing nail: Secondary | ICD-10-CM

## 2023-05-13 MED ORDER — TRIAMCINOLONE ACETONIDE 10 MG/ML IJ SUSP
10.0000 mg | Freq: Once | INTRAMUSCULAR | Status: AC
Start: 2023-05-13 — End: 2023-05-13
  Administered 2023-05-13: 10 mg via INTRA_ARTICULAR

## 2023-05-14 NOTE — Progress Notes (Signed)
Subjective:   Patient ID: Tom [name], male   DOB: 32 y.o.   MRN: [id number]   HPI Patient presents stating that the ingrown is healing well he is getting 1 on the left but he wants to hold off and is getting quite a bit of pain in the big toe joint of his right foot   ROS      Objective:  Physical Exam  Neurovascular status intact inflammation pain around the first MPJ right foot with reduced range of motion no crepitus of the joint was noted     Assessment:  Inflammatory condition first MPJ right with probable functional and beginning to structural hallux limitus well-healed nail site ingrown toenail left     Plan:  H&P x-rays right reviewed discussed all conditions and at this point for the right I have recommended.  Circular injection consideration for orthotics and at 1 point the possibility for biplanar osteotomy.  I did sterile prep injected periarticular around the joint 3 mg Kenalog 5 mg Xylocaine the nailbed is healing well the ingrown left may need to be corrected at 1 point in future  X-rays indicate some deformity of the big toe joint right with elevation in the beginning to spur formation

## 2023-05-16 IMAGING — US US EXTREM LOW VENOUS
1 series · 13 of 24 positions shown · non-contrast
Comparison: None.

CLINICAL DATA: Bilateral lower extremity edema.



[Series 1: us venous img lower bilat (dvt) · portal-venous · 13 of 58 slices shown]
[im 1/58]
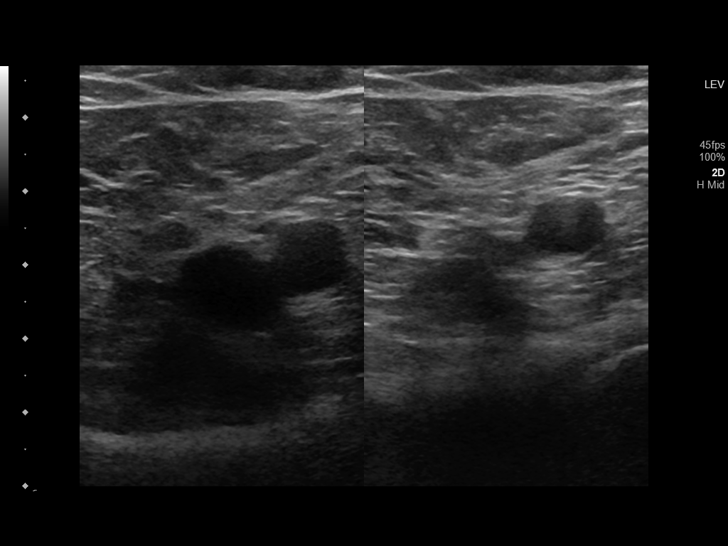
[im 5/58]
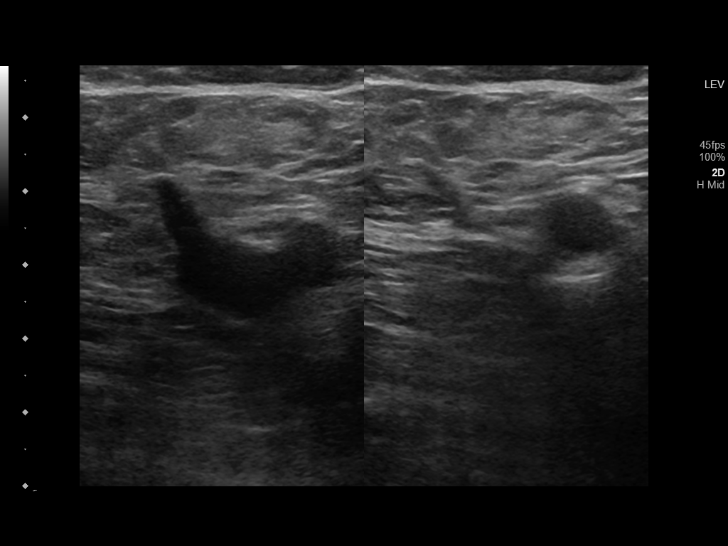
[im 10/58]
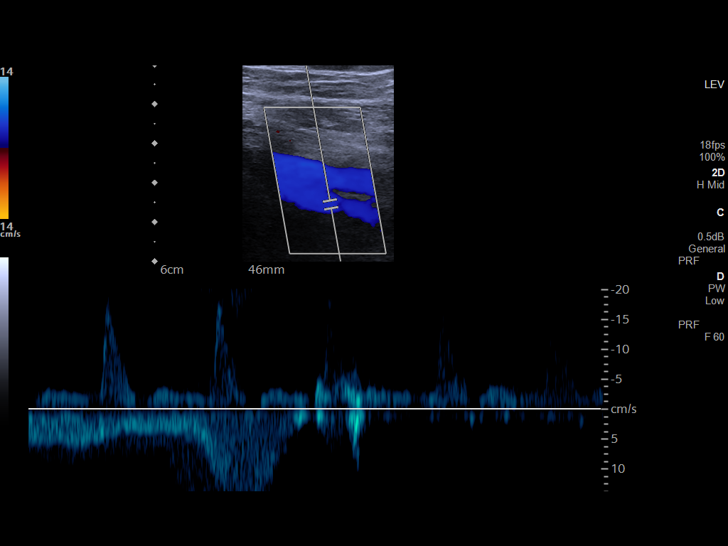
[im 15/58]
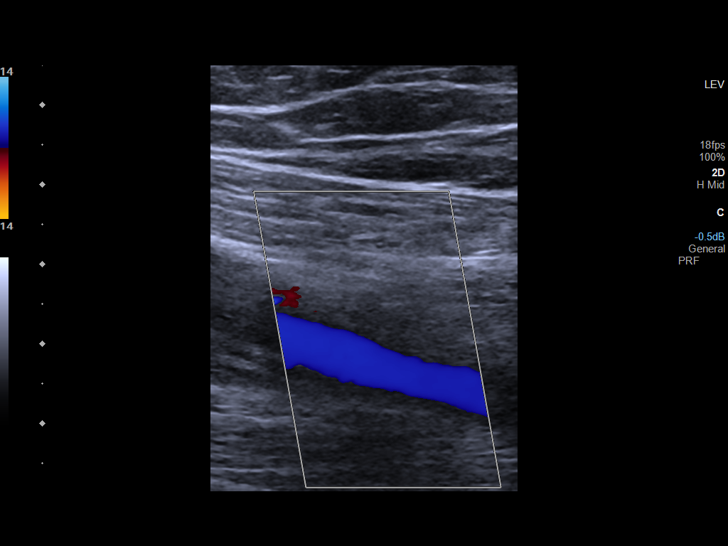
[im 20/58]
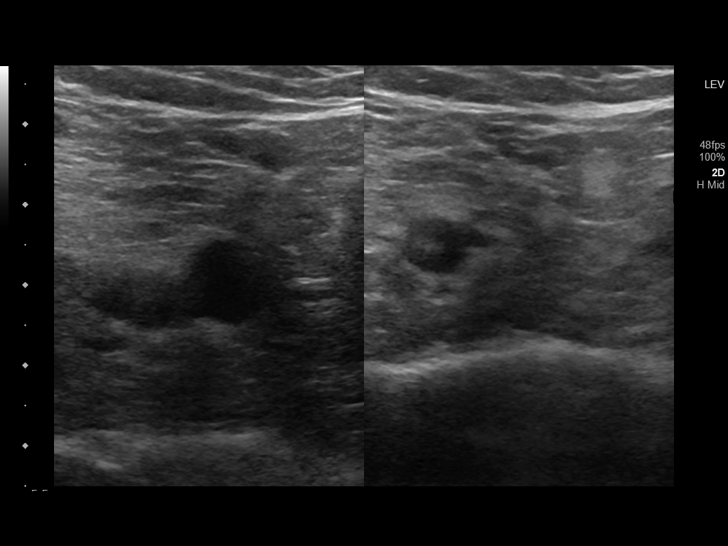
[im 25/58]
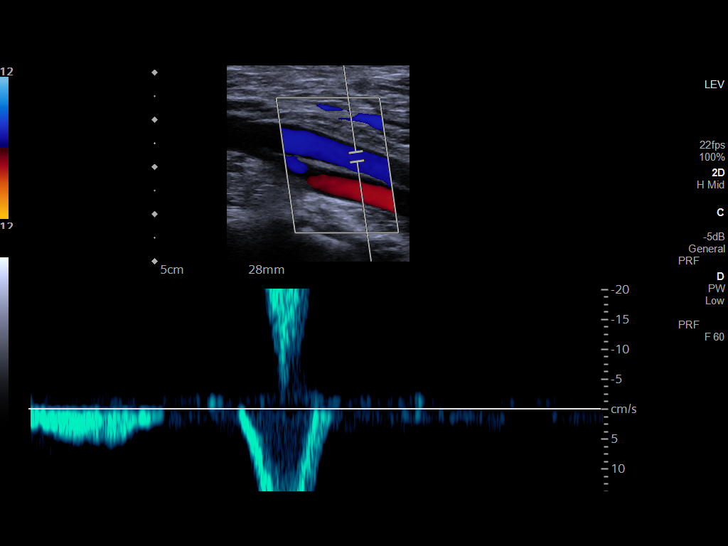
[im 30/58]
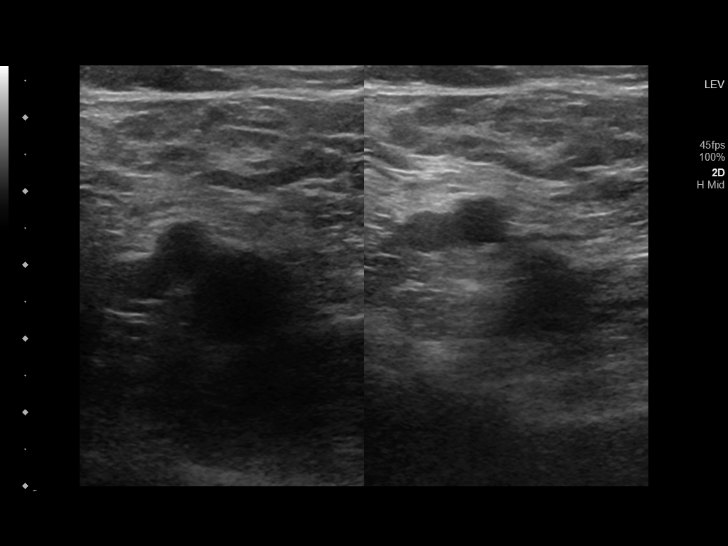
[im 33/58]
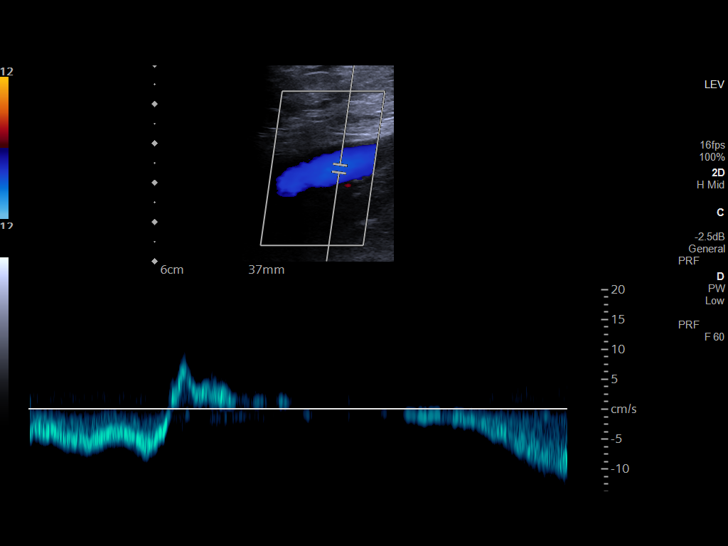
[im 38/58]
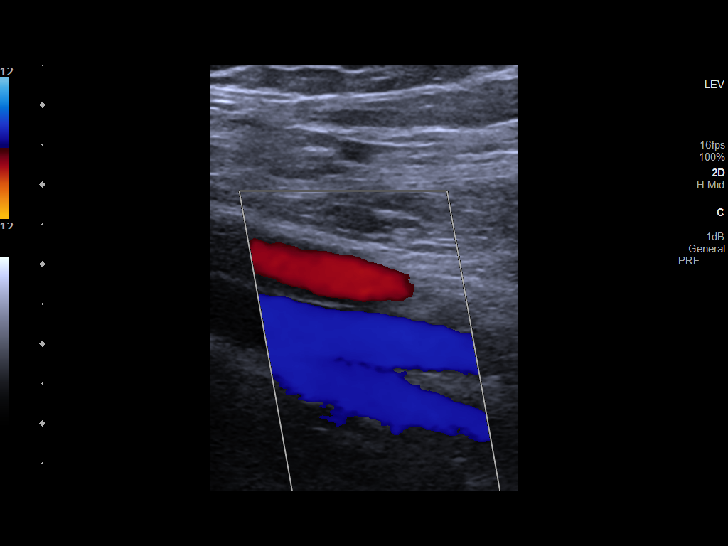
[im 43/58]
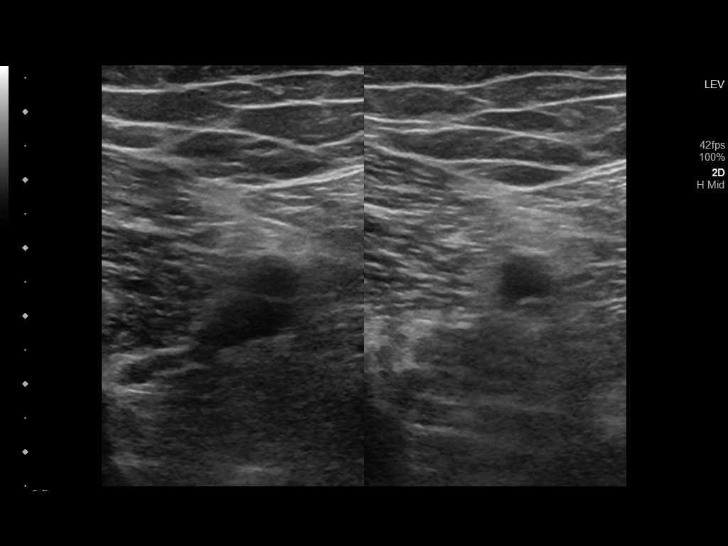
[im 48/58]
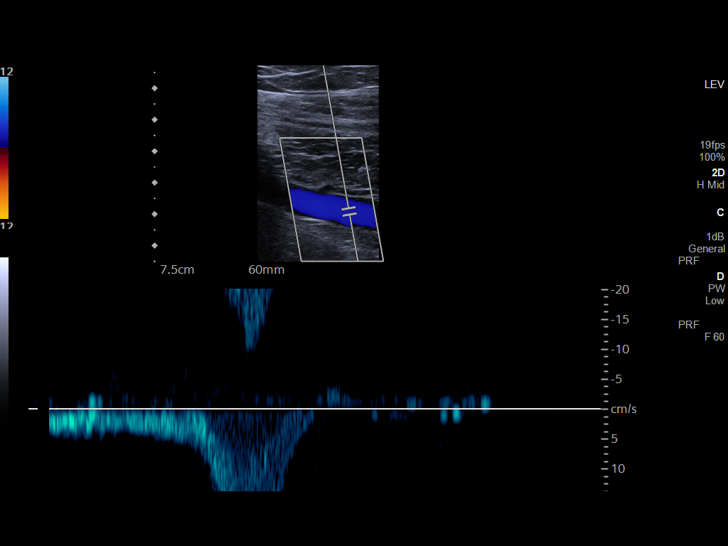
[im 53/58]
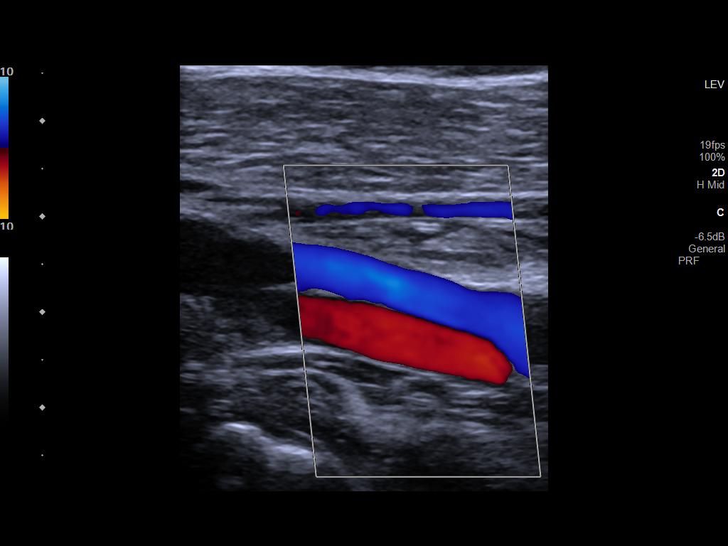
[im 58/58]
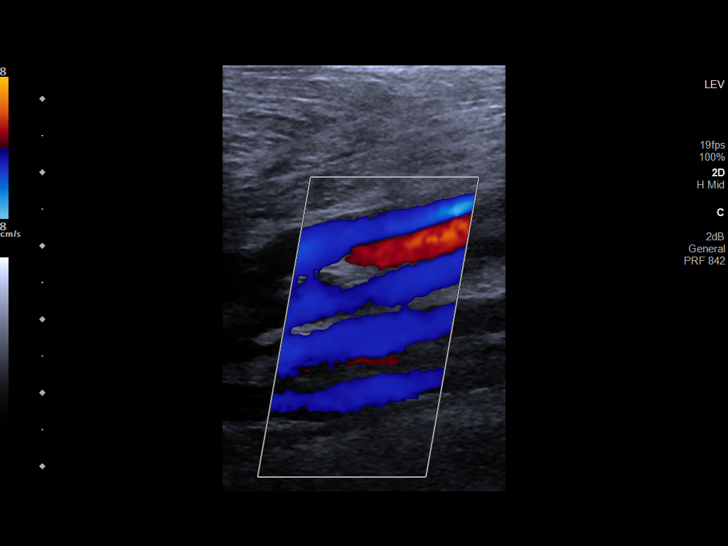

[13 of 24 positions shown; findings below may reference images not displayed]

FINDINGS: RIGHT LOWER EXTREMITY

Common Femoral Vein: No evidence of thrombus. Normal
compressibility, respiratory phasicity and response to augmentation.

Saphenofemoral Junction: No evidence of thrombus. Normal
compressibility and flow on color Doppler imaging.

Profunda Femoral Vein: No evidence of thrombus. Normal
compressibility and flow on color Doppler imaging.

Femoral Vein: No evidence of thrombus. Normal compressibility,
respiratory phasicity and response to augmentation.

Popliteal Vein: No evidence of thrombus. Normal compressibility,
respiratory phasicity and response to augmentation.

Calf Veins: No evidence of thrombus. Normal compressibility and flow
on color Doppler imaging.

Superficial Great Saphenous Vein: No evidence of thrombus. Normal
compressibility.

Venous Reflux:  None.

Other Findings: No evidence of superficial thrombophlebitis or
abnormal fluid collection.

LEFT LOWER EXTREMITY

Common Femoral Vein: No evidence of thrombus. Normal
compressibility, respiratory phasicity and response to augmentation.

Saphenofemoral Junction: No evidence of thrombus. Normal
compressibility and flow on color Doppler imaging.

Profunda Femoral Vein: No evidence of thrombus. Normal
compressibility and flow on color Doppler imaging.

Femoral Vein: No evidence of thrombus. Normal compressibility,
respiratory phasicity and response to augmentation.

Popliteal Vein: No evidence of thrombus. Normal compressibility,
respiratory phasicity and response to augmentation.

Calf Veins: No evidence of thrombus. Normal compressibility and flow
on color Doppler imaging.

Superficial Great Saphenous Vein: No evidence of thrombus. Normal
compressibility.

Venous Reflux:  None.

Other Findings: No evidence of superficial thrombophlebitis or
abnormal fluid collection.
IMPRESSION: No evidence of deep venous thrombosis in either lower extremity.

## 2023-08-17 ENCOUNTER — Other Ambulatory Visit: Payer: Self-pay | Admitting: Internal Medicine

## 2023-08-17 DIAGNOSIS — R1084 Generalized abdominal pain: Secondary | ICD-10-CM

## 2023-08-17 DIAGNOSIS — Z0000 Encounter for general adult medical examination without abnormal findings: Secondary | ICD-10-CM

## 2023-08-18 ENCOUNTER — Ambulatory Visit
Admission: RE | Admit: 2023-08-18 | Discharge: 2023-08-18 | Disposition: A | Discharge status: Home / Self Care | Payer: BC Managed Care – PPO | Source: Ambulatory Visit | Attending: Internal Medicine | Admitting: Internal Medicine

## 2023-08-18 DIAGNOSIS — R1084 Generalized abdominal pain: Secondary | ICD-10-CM | POA: Diagnosis present

## 2023-08-18 DIAGNOSIS — Z0000 Encounter for general adult medical examination without abnormal findings: Secondary | ICD-10-CM | POA: Insufficient documentation

## 2023-08-18 MED ORDER — IOHEXOL 300 MG/ML  SOLN
100.0000 mL | Freq: Once | INTRAMUSCULAR | Status: AC | PRN
  Administered 2023-08-18: 100 mL via INTRAVENOUS

## 2023-08-25 ENCOUNTER — Other Ambulatory Visit: Payer: Self-pay | Admitting: Internal Medicine

## 2023-08-25 DIAGNOSIS — M5416 Radiculopathy, lumbar region: Secondary | ICD-10-CM

## 2023-09-02 ENCOUNTER — Ambulatory Visit: Admission: RE | Admit: 2023-09-02 | Payer: BC Managed Care – PPO | Source: Ambulatory Visit

## 2023-10-31 ENCOUNTER — Emergency Department

## 2023-10-31 ENCOUNTER — Emergency Department
Admission: EM | Admit: 2023-10-31 | Discharge: 2023-10-31 | Disposition: A | Discharge status: Home / Self Care | Attending: Emergency Medicine | Admitting: Emergency Medicine

## 2023-10-31 ENCOUNTER — Other Ambulatory Visit: Payer: Self-pay

## 2023-10-31 DIAGNOSIS — R079 Chest pain, unspecified: Secondary | ICD-10-CM | POA: Diagnosis not present

## 2023-10-31 DIAGNOSIS — K921 Melena: Secondary | ICD-10-CM | POA: Insufficient documentation

## 2023-10-31 DIAGNOSIS — I10 Essential (primary) hypertension: Secondary | ICD-10-CM | POA: Insufficient documentation

## 2023-10-31 DIAGNOSIS — R109 Unspecified abdominal pain: Secondary | ICD-10-CM | POA: Diagnosis present

## 2023-10-31 LAB — BASIC METABOLIC PANEL WITH GFR
Anion gap: 10 (ref 5–15)
BUN: 13 mg/dL (ref 6–20)
CO2: 21 mmol/L — ABNORMAL LOW (ref 22–32)
Calcium: 9.6 mg/dL (ref 8.9–10.3)
Chloride: 105 mmol/L (ref 98–111)
Creatinine, Ser: 0.84 mg/dL (ref 0.61–1.24)
GFR, Estimated: >60 mL/min (ref >60)
Glucose, Bld: 89 mg/dL (ref 70–99)
Potassium: 4.3 mmol/L (ref 3.5–5.1)
Sodium: 136 mmol/L (ref 135–145)

## 2023-10-31 LAB — CBC
HCT: 43 % (ref 39.0–52.0)
Hemoglobin: 14.7 g/dL (ref 13.0–17.0)
MCH: 29.3 pg (ref 26.0–34.0)
MCHC: 34.2 g/dL (ref 30.0–36.0)
MCV: 85.8 fL (ref 80.0–100.0)
Platelets: 249 10*3/uL (ref 150–400)
RBC: 5.01 MIL/uL (ref 4.22–5.81)
RDW: 12.2 % (ref 11.5–15.5)
WBC: 9.7 10*3/uL (ref 4.0–10.5)
nRBC: 0 % (ref 0.0–0.2)

## 2023-10-31 LAB — HEPATIC FUNCTION PANEL
ALT: 61 U/L — ABNORMAL HIGH (ref 0–44)
AST: 37 U/L (ref 15–41)
Albumin: 4.4 g/dL (ref 3.5–5.0)
Alkaline Phosphatase: 58 U/L (ref 38–126)
Bilirubin, Direct: <0.1 mg/dL (ref 0.0–0.2)
Total Bilirubin: 0.7 mg/dL (ref 0.0–1.2)
Total Protein: 8.1 g/dL (ref 6.5–8.1)

## 2023-10-31 LAB — APTT: aPTT: 30 s (ref 24–36)

## 2023-10-31 LAB — TROPONIN I (HIGH SENSITIVITY)
Troponin I (High Sensitivity): 3 ng/L (ref <18)
Troponin I (High Sensitivity): 2 ng/L (ref <18)

## 2023-10-31 LAB — PROTIME-INR
INR: 1.1 (ref 0.8–1.2)
Prothrombin Time: 14.4 s (ref 11.4–15.2)

## 2023-10-31 MED ORDER — PREGABALIN 25 MG PO CAPS
25.0000 mg | ORAL_CAPSULE | Freq: Once | ORAL | Status: AC
  Administered 2023-10-31: 25 mg via ORAL
  Filled 2023-10-31: qty 1

## 2023-10-31 MED ORDER — IOHEXOL 350 MG/ML SOLN
100.0000 mL | Freq: Once | INTRAVENOUS | Status: AC | PRN
  Administered 2023-10-31: 100 mL via INTRAVENOUS

## 2023-10-31 MED ORDER — CELECOXIB 100 MG PO CAPS
100.0000 mg | ORAL_CAPSULE | Freq: Once | ORAL | Status: AC
  Administered 2023-10-31: 100 mg via ORAL
  Filled 2023-10-31: qty 1

## 2023-10-31 MED ORDER — HYDROMORPHONE HCL 1 MG/ML IJ SOLN
0.5000 mg | Freq: Once | INTRAMUSCULAR | Status: AC
  Administered 2023-10-31: 0.5 mg via INTRAVENOUS
  Filled 2023-10-31: qty 0.5

## 2023-10-31 NOTE — ED Provider Notes (Signed)
 [facility] Provider Note    Event Date/Time   First MD Initiated Contact with Patient 10/31/23 1735     (approximate)   History   Chest Pain and Rectal Bleeding   HPI  Tom [name] is a 33 y.o. male with history of Marfan's disease, aortic aneurysm, hypertension, MDD, IBS, presenting with midsternal chest pain.  Also notes blood in his stool.  States that he has chest pain at baseline but noted this morning that he was worse than usual, states it goes from his mid scapula out to the front but does not go down his back.  States that he has chronic abdominal pain that is not changed compared to prior.  States that he noted some blood in his stool this been intermittent and been ongoing for months.  States he has not followed up with GI since he is in the process of getting some of his physicians changed.  States that he also has some decree sensation to his right thigh that is been ongoing for 2 years, has some associated cramping to the thigh.  No actual weakness or new numbness.  He denies any incontinence or saddle anesthesia.  Denies any shortness of breath.  No cough or fever.  States that he is followed by pain management at [facility] as well as cardiothoracic surgery and cardiology at [facility] for his aneurysm.  States that for pain, that Toradol, morphine does not work for him.  On independent chart review, he was seen by pain medicine in December, is on pregabalin as well as duloxetine for his generalized pain as well as chest pain.  States that the medications are typically helpful for his chest pain.  From pain medicines note, he has long history of pain in his hands, toes, back, chest, groin area, legs, headache.  Is on Cymbalta, Lyrica and Celebrex.  Is most significant pain is typically located in his chest and back.     Physical Exam   Triage Vital Signs: ED Triage Vitals  Encounter Vitals Group     BP 10/31/23 1705 (!) 148/95     Systolic BP Percentile --       Diastolic BP Percentile --      Pulse Rate 10/31/23 1705 77     Resp 10/31/23 1705 18     Temp 10/31/23 1705 97.9 F (36.6 C)     Temp Source 10/31/23 1705 Oral     SpO2 10/31/23 1705 98 %     Weight 10/31/23 1706 235 lb (106.6 kg)     Height 10/31/23 1706 6\' 6"  (1.981 m)     Head Circumference --      Peak Flow --      Pain Score 10/31/23 1706 8     Pain Loc --      Pain Education --      Exclude from Growth Chart --     Most recent vital signs: Vitals:   10/31/23 1830 10/31/23 2030  BP: 117/72 113/63  Pulse: 70 62  Resp: 20 13  Temp:    SpO2: 98% 94%     General: Awake, no distress.  CV:  Good peripheral perfusion.  Resp:  Normal effort.  Clear Abd:  No distention.  Soft, mild diffuse tenderness Other:  No focal weakness, he has some decreased sensation to his right thigh that is not new, equal sensation to his rest of his lower extremities.  DP pulses are intact bilaterally and equal, radial pulses  are equal bilaterally.  No saddle anesthesia, no midline spinal tenderness.  Rectal exam was done with chaperone, not a lot of stool in his rectal vault but appears brown, no bloody mucosa   ED Results / Procedures / Treatments   Labs (all labs ordered are listed, but only abnormal results are displayed) Labs Reviewed  BASIC METABOLIC PANEL WITH GFR - Abnormal; Notable for the following components:      Result Value   CO2 21 (*)    All other components within normal limits  HEPATIC FUNCTION PANEL - Abnormal; Notable for the following components:   ALT 61 (*)    All other components within normal limits  CBC  PROTIME-INR  APTT  TROPONIN I (HIGH SENSITIVITY)  TROPONIN I (HIGH SENSITIVITY)     EKG  Sinus rhythm, rate 70, normal QRS, normal QTc, no ischemic ST elevation, he has T wave flattening in 3, not significant change compared to prior   RADIOLOGY CT imaging on my interpretation without obvious dissection   PROCEDURES:  Critical Care performed:  No  Procedures   MEDICATIONS ORDERED IN ED: Medications  HYDROmorphone (DILAUDID) injection 0.5 mg (has no administration in time range)  iohexol (OMNIPAQUE) 350 MG/ML injection 100 mL (100 mLs Intravenous Contrast Given 10/31/23 1832)  HYDROmorphone (DILAUDID) injection 0.5 mg (0.5 mg Intravenous Given 10/31/23 1923)  celecoxib (CELEBREX) capsule 100 mg (100 mg Oral Given 10/31/23 2129)  pregabalin (LYRICA) capsule 25 mg (25 mg Oral Given 10/31/23 2129)     IMPRESSION / MDM / ASSESSMENT AND PLAN / ED COURSE  I reviewed the triage vital signs and the nursing notes.                              Differential diagnosis includes, but is not limited to, acute on chronic pain, ACS, musculoskeletal strain, dissection, for his blood in his stool, appears to be longstanding, will evaluate for anemia, no bloody stool in his rectal vault at this time, also considered IBD, colitis, diverticulitis.  Will get labs, EKG, troponin, CT dissection protocol, patient is requesting for pain meds, will give him a dose of IV Dilaudid here.  Patient's presentation is most consistent with acute presentation with potential threat to life or bodily function.  Independent review of labs imaging are below.  On reassessment patient felt better with the Dilaudid, asking for 1 more dose prior to discharge.  Will give him his home pregabalin as well as his celecoxib.  Discussed with patient extensively about follow-up outpatient, he does not want to follow-up at [facility], states that he will reach out to [facility] to set up primary care follow-up, given GI referral as well as call his pain medicine doctor tomorrow to discuss further management of his chronic chest pain.  Will give him 1 more dose of Dilaudid prior to discharge.  Considered but no indication for inpatient admission at this time, he is safe for outpatient management.  Discharged with strict precautions.  Clinical Course as of 10/31/23 2133  Wynelle Link Oct 31, 2023  1610 CT  Angio Chest/Abd/Pel for Dissection W and/or W/WO IMPRESSION: No evidence of aortic aneurysm or dissection. No evidence of pulmonary embolus.  No acute findings in the chest, abdomen or pelvis.  Left nephrolithiasis.  No hydronephrosis.   [TT]  1923 DG Chest 2 View No active cardiopulmonary disease.  [TT]  1923 Independent review of labs, no leukocytosis, H&H is stable, electrolyte severely deranged, creatinine is  normal, ALT is mildly elevated but rest of LFTs are normal, Trope is negative. [TT]  2007 Troponin I (High Sensitivity) Troponin x 2 is negative. [TT]    Clinical Course User Index [TT] Jodie Echevaria Franchot Erichsen, MD     FINAL CLINICAL IMPRESSION(S) / ED DIAGNOSES   Final diagnoses:  Chest pain, unspecified type  Blood in stool     Rx / DC Orders   ED Discharge Orders     None        Note:  This document was prepared using Dragon voice recognition software and may include unintentional dictation errors.    Claybon Jabs, MD 10/31/23 [phone]

## 2023-10-31 NOTE — Discharge Instructions (Addendum)
 Please make sure to set up follow-up with your [facility] cardiologist as well as cardiothoracic surgeon.  I understand that you would like to follow-up with [facility] for your primary care physician as well as GI referral.  I have given the number for you to call for our GI at [facility] just in case you would follow-up here .  Please make sure to call your pain medicine doctor tomorrow to discuss further management of your symptoms.

## 2023-10-31 NOTE — ED Triage Notes (Signed)
 Pt sts that he has been having chest pain as well as blood in his stool. Pt sts that he has a aortic aneurysm and pt sts the pain has increased. Pt also sts that he has been having leg pain that is unusual for him.

## 2024-01-06 ENCOUNTER — Emergency Department
Admission: EM | Admit: 2024-01-06 | Discharge: 2024-01-06 | Discharge status: Against Medical Advice | Payer: Self-pay | Attending: Emergency Medicine | Admitting: Emergency Medicine

## 2024-01-06 ENCOUNTER — Other Ambulatory Visit: Payer: Self-pay

## 2024-01-06 DIAGNOSIS — Z5321 Procedure and treatment not carried out due to patient leaving prior to being seen by health care provider: Secondary | ICD-10-CM | POA: Insufficient documentation

## 2024-01-06 DIAGNOSIS — R0789 Other chest pain: Secondary | ICD-10-CM | POA: Insufficient documentation

## 2024-01-06 DIAGNOSIS — K5900 Constipation, unspecified: Secondary | ICD-10-CM | POA: Insufficient documentation

## 2024-01-06 LAB — COMPREHENSIVE METABOLIC PANEL WITH GFR
ALT: 47 U/L — ABNORMAL HIGH (ref 0–44)
AST: 30 U/L (ref 15–41)
Albumin: 4.6 g/dL (ref 3.5–5.0)
Alkaline Phosphatase: 54 U/L (ref 38–126)
Anion gap: 11 (ref 5–15)
BUN: 15 mg/dL (ref 6–20)
CO2: 22 mmol/L (ref 22–32)
Calcium: 9.7 mg/dL (ref 8.9–10.3)
Chloride: 101 mmol/L (ref 98–111)
Creatinine, Ser: 0.94 mg/dL (ref 0.61–1.24)
GFR, Estimated: >60 mL/min (ref >60)
Glucose, Bld: 108 mg/dL — ABNORMAL HIGH (ref 70–99)
Potassium: 4.2 mmol/L (ref 3.5–5.1)
Sodium: 134 mmol/L — ABNORMAL LOW (ref 135–145)
Total Bilirubin: 0.9 mg/dL (ref 0.0–1.2)
Total Protein: 8.7 g/dL — ABNORMAL HIGH (ref 6.5–8.1)

## 2024-01-06 LAB — CBC
HCT: 45.3 % (ref 39.0–52.0)
Hemoglobin: 15.7 g/dL (ref 13.0–17.0)
MCH: 29.3 pg (ref 26.0–34.0)
MCHC: 34.7 g/dL (ref 30.0–36.0)
MCV: 84.7 fL (ref 80.0–100.0)
Platelets: 303 10*3/uL (ref 150–400)
RBC: 5.35 MIL/uL (ref 4.22–5.81)
RDW: 11.9 % (ref 11.5–15.5)
WBC: 10.7 10*3/uL — ABNORMAL HIGH (ref 4.0–10.5)
nRBC: 0 % (ref 0.0–0.2)

## 2024-01-06 LAB — LIPASE, BLOOD: Lipase: 29 U/L (ref 11–51)

## 2024-01-06 MED ORDER — ONDANSETRON 4 MG PO TBDP
4.0000 mg | ORAL_TABLET | Freq: Once | ORAL | Status: AC | PRN
  Administered 2024-01-06: 4 mg via ORAL
  Filled 2024-01-06: qty 1

## 2024-01-06 NOTE — ED Triage Notes (Addendum)
 Pt arrives via POV with c/o constipated and hasn't had a bowel movement in 7 days. Pt states that they did have a small bowel movement "rabbit turds" that "feels like it's going around a blockage," and have had a couple "[place]  mudslides that is like peeing out of my butt." Pt states that every night around 0300 in the middle of their abdomen and on the RUQ that makes them get into the fetal position. Pt has been dealing with nausea during this time and has thrown up 2x from the pain. Pt has a HX of an aortic aneurism and the pain in their abdomen is causing chest pressure. Pt is A&Ox4 and ambulatory during triage.

## 2024-01-07 ENCOUNTER — Emergency Department
Admission: EM | Admit: 2024-01-07 | Discharge: 2024-01-07 | Disposition: A | Discharge status: Home / Self Care | Payer: Self-pay | Attending: Emergency Medicine | Admitting: Emergency Medicine

## 2024-01-07 ENCOUNTER — Emergency Department: Payer: Self-pay

## 2024-01-07 DIAGNOSIS — R079 Chest pain, unspecified: Secondary | ICD-10-CM

## 2024-01-07 DIAGNOSIS — K5900 Constipation, unspecified: Secondary | ICD-10-CM | POA: Insufficient documentation

## 2024-01-07 DIAGNOSIS — R0789 Other chest pain: Secondary | ICD-10-CM | POA: Diagnosis not present

## 2024-01-07 LAB — CBC
HCT: 44.6 % (ref 39.0–52.0)
Hemoglobin: 15.4 g/dL (ref 13.0–17.0)
MCH: 29.4 pg (ref 26.0–34.0)
MCHC: 34.5 g/dL (ref 30.0–36.0)
MCV: 85.1 fL (ref 80.0–100.0)
Platelets: 267 10*3/uL (ref 150–400)
RBC: 5.24 MIL/uL (ref 4.22–5.81)
RDW: 11.7 % (ref 11.5–15.5)
WBC: 10.5 10*3/uL (ref 4.0–10.5)
nRBC: 0 % (ref 0.0–0.2)

## 2024-01-07 LAB — BASIC METABOLIC PANEL WITH GFR
Anion gap: 11 (ref 5–15)
BUN: 17 mg/dL (ref 6–20)
CO2: 24 mmol/L (ref 22–32)
Calcium: 9.6 mg/dL (ref 8.9–10.3)
Chloride: 100 mmol/L (ref 98–111)
Creatinine, Ser: 1.01 mg/dL (ref 0.61–1.24)
GFR, Estimated: >60 mL/min (ref >60)
Glucose, Bld: 145 mg/dL — ABNORMAL HIGH (ref 70–99)
Potassium: 4.1 mmol/L (ref 3.5–5.1)
Sodium: 135 mmol/L (ref 135–145)

## 2024-01-07 LAB — TROPONIN I (HIGH SENSITIVITY): Troponin I (High Sensitivity): 3 ng/L (ref <18)

## 2024-01-07 MED ORDER — POLYETHYLENE GLYCOL 3350 17 GM/SCOOP PO POWD: 255.0000 g | Freq: Once | ORAL | 0 refills | Status: AC

## 2024-01-07 MED ORDER — ONDANSETRON 8 MG PO TBDP: 8.0000 mg | ORAL_TABLET | Freq: Three times a day (TID) | ORAL | 0 refills | Status: DC | PRN

## 2024-01-07 MED ORDER — ONDANSETRON 4 MG PO TBDP
4.0000 mg | ORAL_TABLET | Freq: Once | ORAL | Status: AC
  Administered 2024-01-07: 4 mg via ORAL
  Filled 2024-01-07: qty 1

## 2024-01-07 MED ORDER — POLYETHYLENE GLYCOL 3350 17 GM/SCOOP PO POWD: 255.0000 g | Freq: Once | ORAL | 0 refills | Status: DC

## 2024-01-07 NOTE — ED Provider Notes (Signed)
 [facility] Provider Note   Event Date/Time   First MD Initiated Contact with Patient 01/07/24 1758     (approximate) History  No chief complaint on file.  HPI Tom [name] is a 33 y.o. male with a past medical history of Marfan syndrome, recurrent kidney stones, AAA who presents complaining of recurrent constipation.  Patient states that he has had issues with constipation over the last few years and is going to be followed by GI within the next few weeks.  Patient states that he has been using Dulcolax and MiraLAX at home with no relief.  Patient also endorses left and right upper abdominal quadrant as well as lower chest wall pain over this time as well. ROS: Patient currently denies any vision changes, tinnitus, difficulty speaking, facial droop, sore throat, shortness of breath, diarrhea, dysuria, or weakness/numbness/paresthesias in any extremity   Physical Exam  Triage Vital Signs: ED Triage Vitals  Encounter Vitals Group     BP 01/07/24 1722 (!) 146/102     Systolic BP Percentile --      Diastolic BP Percentile --      Pulse Rate 01/07/24 1722 (!) 104     Resp 01/07/24 1722 16     Temp 01/07/24 1722 98.5 F (36.9 C)     Temp Source 01/07/24 1722 Oral     SpO2 01/07/24 1722 100 %     Weight 01/07/24 1721 250 lb (113.4 kg)     Height 01/07/24 1721 6\' 7"  (2.007 m)     Head Circumference --      Peak Flow --      Pain Score 01/07/24 1722 10     Pain Loc --      Pain Education --      Exclude from Growth Chart --    Most recent vital signs: Vitals:   01/07/24 1722  BP: (!) 146/102  Pulse: (!) 104  Resp: 16  Temp: 98.5 F (36.9 C)  SpO2: 100%   General: Awake, oriented x4. CV:  Good peripheral perfusion. Resp:  Normal effort. Abd:  No distention. Other:  Middle-aged marfanoid well-developed, well-nourished Caucasian male resting comfortably in no acute distress ED Results / Procedures / Treatments  Labs (all labs ordered are listed, but  only abnormal results are displayed) Labs Reviewed  BASIC METABOLIC PANEL WITH GFR - Abnormal; Notable for the following components:      Result Value   Glucose, Bld 145 (*)    All other components within normal limits  CBC  TROPONIN I (HIGH SENSITIVITY)  TROPONIN I (HIGH SENSITIVITY)   EKG ED ECG REPORT I, Charleen Conn, the attending physician, personally viewed and interpreted this ECG. Date: 01/07/2024 EKG Time: 1721 Rate: 104 Rhythm: Tachycardic sinus rhythm QRS Axis: normal Intervals: normal ST/T Wave abnormalities: normal Narrative Interpretation: Tachycardic sinus rhythm.  No evidence of acute ischemia RADIOLOGY ED MD interpretation: Single view portable x-ray of the abdomen shows nonspecific bowel gas pattern with paucity of bowel gas within the central and left hemiabdomen possibly reflecting under distention/compressed or fluid-filled bowel loops.  There is small volume stool in the ascending colon 2 view chest x-ray interpreted by me shows no evidence of acute abnormalities including no pneumonia, pneumothorax, or widened mediastinum - All radiology independently interpreted and agree with radiology assessment Official radiology report(s): DG Abd 1 View Result Date: 01/07/2024 CLINICAL DATA:  Constipation EXAM: ABDOMEN - 1 VIEW COMPARISON:  CTA abdomen and pelvis dated 10/31/2023 FINDINGS: Nonspecific bowel  gas pattern. Paucity of bowel gas within the central and left hemiabdomen. No free air or pneumatosis. Small volume stool in the ascending colon. No abnormal radio-opaque calculi or mass effect. No acute or substantial osseous abnormality. The sacrum and coccyx are partially obscured by overlying bowel contents. IMPRESSION: 1. Nonspecific bowel gas pattern. Paucity of bowel gas within the central and left hemiabdomen may reflect underdistended/compressed or fluid-filled bowel loops. 2. Small volume stool in the ascending colon. Electronically Signed   By: Limin  Xu M.D.    On: 01/07/2024 18:04   DG Chest 2 View Result Date: 01/07/2024 CLINICAL DATA:  chest pain EXAM: CHEST - 2 VIEW COMPARISON:  None available. FINDINGS: No focal airspace consolidation, pleural effusion, or pneumothorax. No cardiomegaly. No acute fracture or destructive lesion. IMPRESSION: No acute cardiopulmonary abnormality. Electronically Signed   By: Rance Burrows M.D.   On: 01/07/2024 17:47   PROCEDURES: Critical Care performed: No .1-3 Lead EKG Interpretation  Performed by: Charleen Conn, MD Authorized by: Charleen Conn, MD     Interpretation: normal     ECG rate:  91   ECG rate assessment: normal     Rhythm: sinus rhythm     Ectopy: none     Conduction: normal    MEDICATIONS ORDERED IN ED: Medications  ondansetron  (ZOFRAN -ODT) disintegrating tablet 4 mg (4 mg Oral Given 01/07/24 1726)   IMPRESSION / MDM / ASSESSMENT AND PLAN / ED COURSE  I reviewed the triage vital signs and the nursing notes.                             The patient is on the cardiac monitor to evaluate for evidence of arrhythmia and/or significant heart rate changes. Patient's presentation is most consistent with acute presentation with potential threat to life or bodily function. Patient's history and exam most consistent with constipation as an etiology for their pain.  Patient's symptoms not typical for other emergent causes of abdominal pain such as, but not limited to, appendicitis, abdominal aortic aneurysm, pancreatitis, SBO, mesenteric ischemia, serious intra-abdominal bacterial illness.  Patient without red flags concerning for cancer as a constipation etiology.  Rx: Miralax  Disposition:  Patient will be discharged with strict return precautions and follow up with primary MD within 24-48 hours for further evaluation. Patient understands that this still may have an early presentation of an emergent medical condition such as appendicitis that will require a recheck.   FINAL CLINICAL  IMPRESSION(S) / ED DIAGNOSES   Final diagnoses:  Constipation, unspecified constipation type  Chest pain, unspecified type   Rx / DC Orders   ED Discharge Orders          Ordered    polyethylene glycol powder (GLYCOLAX/MIRALAX) 17 GM/SCOOP powder   Once,   Status:  Discontinued        01/07/24 1841    polyethylene glycol powder (GLYCOLAX/MIRALAX) 17 GM/SCOOP powder   Once        01/07/24 1850    ondansetron  (ZOFRAN -ODT) 8 MG disintegrating tablet  Every 8 hours PRN        01/07/24 1853           Note:  This document was prepared using Dragon voice recognition software and may include unintentional dictation errors.   Charleen Conn, MD 01/07/24 [id number]

## 2024-01-07 NOTE — Discharge Instructions (Addendum)
 Please use MiraLAX one half capful every hour until your first bowel movement.  Please do not take any MiraLAX after this for at least 24 hours.  You may use one half capful twice a day of MiraLAX in order to have 1 solid well-formed bowel movement per day.  You may increase or decrease this dosage as needed to obtain this 1 well-formed bowel movement.  Please make sure that you are drinking at least 8 ounces of water every hour during this initial bowel regimen. ?

## 2024-01-07 NOTE — ED Triage Notes (Addendum)
 C/o CP started last night that radiates to ABD pain and nausea. Pt reports 11 days since last BM- was seen yesterday for same. Report dizziness and SOB started x7 days ago. Pt reported taking laxatives without relief. PMH: aortic aneurysm, HTN GCS 15. Ambulatory in triage.

## 2024-01-07 NOTE — ED Notes (Signed)
 Patient declined discharge vital signs.

## 2024-01-12 DIAGNOSIS — K5909 Other constipation: Secondary | ICD-10-CM | POA: Diagnosis not present

## 2024-01-12 DIAGNOSIS — R12 Heartburn: Secondary | ICD-10-CM | POA: Diagnosis not present

## 2024-01-12 DIAGNOSIS — K625 Hemorrhage of anus and rectum: Secondary | ICD-10-CM | POA: Diagnosis not present

## 2024-01-14 ENCOUNTER — Emergency Department: Payer: MEDICAID

## 2024-01-14 ENCOUNTER — Emergency Department
Admission: EM | Admit: 2024-01-14 | Discharge: 2024-01-14 | Disposition: A | Discharge status: Home / Self Care | Payer: MEDICAID | Attending: Emergency Medicine | Admitting: Emergency Medicine

## 2024-01-14 ENCOUNTER — Other Ambulatory Visit: Payer: Self-pay

## 2024-01-14 DIAGNOSIS — R55 Syncope and collapse: Secondary | ICD-10-CM | POA: Insufficient documentation

## 2024-01-14 DIAGNOSIS — R109 Unspecified abdominal pain: Secondary | ICD-10-CM | POA: Diagnosis not present

## 2024-01-14 DIAGNOSIS — R1030 Lower abdominal pain, unspecified: Secondary | ICD-10-CM | POA: Diagnosis not present

## 2024-01-14 LAB — CBC WITH DIFFERENTIAL/PLATELET
Abs Immature Granulocytes: 0.18 10*3/uL — ABNORMAL HIGH (ref 0.00–0.07)
Basophils Absolute: 0 10*3/uL (ref 0.0–0.1)
Basophils Relative: 0 %
Eosinophils Absolute: 0.2 10*3/uL (ref 0.0–0.5)
Eosinophils Relative: 2 %
HCT: 39.2 % (ref 39.0–52.0)
Hemoglobin: 13.4 g/dL (ref 13.0–17.0)
Immature Granulocytes: 2 %
Lymphocytes Relative: 15 %
Lymphs Abs: 1.4 10*3/uL (ref 0.7–4.0)
MCH: 28.9 pg (ref 26.0–34.0)
MCHC: 34.2 g/dL (ref 30.0–36.0)
MCV: 84.5 fL (ref 80.0–100.0)
Monocytes Absolute: 0.9 10*3/uL (ref 0.1–1.0)
Monocytes Relative: 10 %
Neutro Abs: 6.4 10*3/uL (ref 1.7–7.7)
Neutrophils Relative %: 71 %
Platelets: 240 10*3/uL (ref 150–400)
RBC: 4.64 MIL/uL (ref 4.22–5.81)
RDW: 11.7 % (ref 11.5–15.5)
WBC: 9.2 10*3/uL (ref 4.0–10.5)
nRBC: 0 % (ref 0.0–0.2)

## 2024-01-14 LAB — COMPREHENSIVE METABOLIC PANEL WITH GFR
ALT: 30 U/L (ref 0–44)
AST: 21 U/L (ref 15–41)
Albumin: 4.1 g/dL (ref 3.5–5.0)
Alkaline Phosphatase: 48 U/L (ref 38–126)
Anion gap: 8 (ref 5–15)
BUN: 15 mg/dL (ref 6–20)
CO2: 26 mmol/L (ref 22–32)
Calcium: 9.6 mg/dL (ref 8.9–10.3)
Chloride: 102 mmol/L (ref 98–111)
Creatinine, Ser: 0.97 mg/dL (ref 0.61–1.24)
GFR, Estimated: >60 mL/min (ref >60)
Glucose, Bld: 80 mg/dL (ref 70–99)
Potassium: 4.4 mmol/L (ref 3.5–5.1)
Sodium: 136 mmol/L (ref 135–145)
Total Bilirubin: 1 mg/dL (ref 0.0–1.2)
Total Protein: 7.8 g/dL (ref 6.5–8.1)

## 2024-01-14 LAB — TROPONIN I (HIGH SENSITIVITY): Troponin I (High Sensitivity): 3 ng/L (ref <18)

## 2024-01-14 LAB — URINALYSIS, ROUTINE W REFLEX MICROSCOPIC
Bilirubin Urine: NEGATIVE
Glucose, UA: NEGATIVE mg/dL
Hgb urine dipstick: NEGATIVE
Ketones, ur: NEGATIVE mg/dL
Leukocytes,Ua: NEGATIVE
Nitrite: NEGATIVE
Protein, ur: NEGATIVE mg/dL
Specific Gravity, Urine: 1.025 (ref 1.005–1.030)
pH: 5 (ref 5.0–8.0)

## 2024-01-14 MED ORDER — IOHEXOL 300 MG/ML  SOLN
100.0000 mL | Freq: Once | INTRAMUSCULAR | Status: AC | PRN
Start: 2024-01-14 — End: 2024-01-14
  Administered 2024-01-14: 100 mL via INTRAVENOUS

## 2024-01-14 MED ORDER — KETOROLAC TROMETHAMINE 15 MG/ML IJ SOLN: 15.0000 mg | Freq: Once | INTRAMUSCULAR | Status: DC

## 2024-01-14 MED ORDER — SODIUM CHLORIDE 0.9 % IV BOLUS
1000.0000 mL | Freq: Once | INTRAVENOUS | Status: AC
  Administered 2024-01-14: 1000 mL via INTRAVENOUS

## 2024-01-14 MED ORDER — ONDANSETRON 4 MG PO TBDP: 4.0000 mg | ORAL_TABLET | Freq: Three times a day (TID) | ORAL | 0 refills | Status: DC | PRN

## 2024-01-14 MED ORDER — PANTOPRAZOLE SODIUM 40 MG PO TBEC: 40.0000 mg | DELAYED_RELEASE_TABLET | Freq: Every day | ORAL | 1 refills | Status: DC

## 2024-01-14 MED ORDER — HYDROMORPHONE HCL 1 MG/ML IJ SOLN
1.0000 mg | Freq: Once | INTRAMUSCULAR | Status: AC
  Administered 2024-01-14: 1 mg via INTRAVENOUS
  Filled 2024-01-14: qty 1

## 2024-01-14 MED ORDER — ONDANSETRON HCL 4 MG/2ML IJ SOLN
4.0000 mg | Freq: Once | INTRAMUSCULAR | Status: AC
  Administered 2024-01-14: 4 mg via INTRAVENOUS
  Filled 2024-01-14: qty 2

## 2024-01-14 MED ORDER — FENTANYL CITRATE PF 50 MCG/ML IJ SOSY
100.0000 ug | PREFILLED_SYRINGE | Freq: Once | INTRAMUSCULAR | Status: AC
  Administered 2024-01-14: 100 ug via INTRAVENOUS
  Filled 2024-01-14: qty 2

## 2024-01-14 NOTE — ED Notes (Signed)
 Patient taken to CT scan.

## 2024-01-14 NOTE — ED Provider Notes (Signed)
 [facility] Provider Note    Event Date/Time   First MD Initiated Contact with Patient 01/14/24 1046     (approximate)   History   Constipation   HPI  Tom [name] is a 33 y.o. male history of a aortic aneurysm that is being monitored by [facility], questionable Marfan syndrome, kidney stones presents emergency department.  Patient's had problems with constipation for the last 2 weeks.  Did his instructed with the MiraLAX  and got watery stool but no solid stool.  Since then patient has been unable to have a bowel movement in about 3 days.  Now started vomiting.  Patient got dizzy fell and hit his head earlier today.  Denies numbness tingling.  Denies chest pain      Physical Exam   Triage Vital Signs: ED Triage Vitals [01/14/24 0943]  Encounter Vitals Group     BP (!) 147/96     Girls Systolic BP Percentile      Girls Diastolic BP Percentile      Boys Systolic BP Percentile      Boys Diastolic BP Percentile      Pulse Rate 82     Resp 16     Temp 98.1 F (36.7 C)     Temp Source Oral     SpO2 100 %     Weight 250 lb (113.4 kg)     Height 6' 7 (2.007 m)     Head Circumference      Peak Flow      Pain Score 7     Pain Loc      Pain Education      Exclude from Growth Chart     Most recent vital signs: Vitals:   01/14/24 1330 01/14/24 1400  BP: (!) 153/97 (!) 150/108  Pulse: 66 67  Resp: 14 18  Temp:    SpO2: 98% 94%     General: Awake, no distress.   CV:  Good peripheral perfusion. regular rate and  rhythm Resp:  Normal effort. Lungs cta Abd:  No distention.  Tender in the right upper quadrant, left lower quadrant, bowel sounds present in all 4 quadrants Other:  C-spine tender to palpation, to the back of skull is tender to palpation, PERRL, EOMI, cranial nerves II through XII grossly intact   ED Results / Procedures / Treatments   Labs (all labs ordered are listed, but only abnormal results are displayed) Labs Reviewed  CBC  WITH DIFFERENTIAL/PLATELET - Abnormal; Notable for the following components:      Result Value   Abs Immature Granulocytes 0.18 (*)    All other components within normal limits  URINALYSIS, ROUTINE W REFLEX MICROSCOPIC - Abnormal; Notable for the following components:   Color, Urine YELLOW (*)    APPearance CLEAR (*)    All other components within normal limits  COMPREHENSIVE METABOLIC PANEL WITH GFR  TROPONIN I (HIGH SENSITIVITY)     EKG  EKG   RADIOLOGY CT of the head, C-spine, CT abdomen pelvis IV contrast    PROCEDURES:   Procedures  Critical Care:  no Chief Complaint  Patient presents with   Constipation      MEDICATIONS ORDERED IN ED: Medications  sodium chloride 0.9 % bolus 1,000 mL (0 mLs Intravenous Stopped 01/14/24 1406)  fentaNYL  (SUBLIMAZE ) injection 100 mcg (100 mcg Intravenous Given 01/14/24 1214)  ondansetron  (ZOFRAN ) injection 4 mg (4 mg Intravenous Given 01/14/24 1208)  iohexol  (OMNIPAQUE ) 300 MG/ML solution 100 mL (100 mLs  Intravenous Contrast Given 01/14/24 1244)  HYDROmorphone  (DILAUDID ) injection 1 mg (1 mg Intravenous Given 01/14/24 1331)     IMPRESSION / MDM / ASSESSMENT AND PLAN / ED COURSE  I reviewed the triage vital signs and the nursing notes.                              Differential diagnosis includes, but is not limited to, syncope, dizziness, subdural, SAH, CVA, C-spine fracture, AAA, acute cholecystitis, pancreatitis, acute appendicitis, diverticulitis, bowel obstruction  Patient's presentation is most consistent with acute presentation with potential threat to life or bodily function.   Cardiac monitor yes, patient placed on cardiac monitor due to syncope and Marfan syndrome to evaluate for arrhythmia Medications given: Patient given fentanyl  100 mcg IV, Zofran  4 mg IV, and normal saline 1 L IV  Labs, EKG, cardiac monitor, imaging ordered   Labs are reassuring Cardiac monitor does not show any change in rhythm CT of the head,  C-spine, abdomen pelvis independently reviewed interpreted by me as being negative for any acute abnormality  Did explain all these findings to the patient.  He been given multiple doses of pain medication.  Do not feel that pain medication is necessary at discharge.  He was given a prescription for Protonix and for Zofran  ODT.  Follow-up with GI.  He is to call and make an appointment.  Return emergency department if worsening.  He is in agreement treatment plan.  Discharged stable condition.   FINAL CLINICAL IMPRESSION(S) / ED DIAGNOSES   Final diagnoses:  Abdominal pain, unspecified abdominal location  Syncope, unspecified syncope type     Rx / DC Orders   ED Discharge Orders          Ordered    ondansetron  (ZOFRAN -ODT) 4 MG disintegrating tablet  Every 8 hours PRN        01/14/24 1402    pantoprazole (PROTONIX) 40 MG tablet  Daily        01/14/24 1402             Note:  This document was prepared using Dragon voice recognition software and may include unintentional dictation errors.    Delsie Figures, PA-C 01/14/24 1445    Bryson Carbine, MD 01/14/24 [id number]

## 2024-01-14 NOTE — ED Notes (Signed)
 Pt states he is here with constipation and states ABD pain is radiating to chest, pt denies SOB. Pt states he has not had a BM in 19 days. Pt states I've had the CP since the last time I was here. NAD noted. Pt states taking OTC meds for constipation with no relief. Pt states sometimes I pass gas.

## 2024-01-14 NOTE — ED Notes (Signed)
 Patient ambulated with use of cane and has a steady gait from department. Patient is with family. No acute distress.

## 2024-01-19 DIAGNOSIS — N200 Calculus of kidney: Secondary | ICD-10-CM | POA: Diagnosis not present

## 2024-02-12 DIAGNOSIS — Z419 Encounter for procedure for purposes other than remedying health state, unspecified: Secondary | ICD-10-CM | POA: Diagnosis not present

## 2024-03-03 DIAGNOSIS — I77810 Thoracic aortic ectasia: Secondary | ICD-10-CM | POA: Diagnosis not present

## 2024-03-03 DIAGNOSIS — Q2543 Congenital aneurysm of aorta: Secondary | ICD-10-CM | POA: Diagnosis not present

## 2024-03-14 DIAGNOSIS — Z419 Encounter for procedure for purposes other than remedying health state, unspecified: Secondary | ICD-10-CM | POA: Diagnosis not present

## 2024-04-14 DIAGNOSIS — Z419 Encounter for procedure for purposes other than remedying health state, unspecified: Secondary | ICD-10-CM | POA: Diagnosis not present

## 2024-07-24 ENCOUNTER — Ambulatory Visit: Payer: Self-pay

## 2024-07-24 ENCOUNTER — Ambulatory Visit

## 2024-07-24 VITALS — BP 121/79 | HR 85 | Temp 98.3°F | Ht 79.0 in | Wt 238.1 lb

## 2024-07-24 DIAGNOSIS — G894 Chronic pain syndrome: Secondary | ICD-10-CM | POA: Diagnosis not present

## 2024-07-24 DIAGNOSIS — R222 Localized swelling, mass and lump, trunk: Secondary | ICD-10-CM | POA: Diagnosis not present

## 2024-07-24 DIAGNOSIS — F411 Generalized anxiety disorder: Secondary | ICD-10-CM | POA: Diagnosis not present

## 2024-07-24 DIAGNOSIS — R251 Tremor, unspecified: Secondary | ICD-10-CM | POA: Diagnosis not present

## 2024-07-24 DIAGNOSIS — I7121 Aneurysm of the ascending aorta, without rupture: Secondary | ICD-10-CM

## 2024-07-24 DIAGNOSIS — F332 Major depressive disorder, recurrent severe without psychotic features: Secondary | ICD-10-CM | POA: Diagnosis not present

## 2024-07-24 DIAGNOSIS — R110 Nausea: Secondary | ICD-10-CM

## 2024-07-24 DIAGNOSIS — Z1159 Encounter for screening for other viral diseases: Secondary | ICD-10-CM

## 2024-07-24 DIAGNOSIS — N5089 Other specified disorders of the male genital organs: Secondary | ICD-10-CM | POA: Diagnosis not present

## 2024-07-24 DIAGNOSIS — Z7689 Persons encountering health services in other specified circumstances: Secondary | ICD-10-CM

## 2024-07-24 DIAGNOSIS — K219 Gastro-esophageal reflux disease without esophagitis: Secondary | ICD-10-CM | POA: Diagnosis not present

## 2024-07-24 DIAGNOSIS — I10 Essential (primary) hypertension: Secondary | ICD-10-CM

## 2024-07-24 DIAGNOSIS — Z113 Encounter for screening for infections with a predominantly sexual mode of transmission: Secondary | ICD-10-CM

## 2024-07-24 DIAGNOSIS — Z130 Encounter for screening for diseases of the blood and blood-forming organs and certain disorders involving the immune mechanism: Secondary | ICD-10-CM

## 2024-07-24 MED ORDER — SERTRALINE HCL 50 MG PO TABS: ORAL_TABLET | ORAL | 2 refills | Status: AC

## 2024-07-24 MED ORDER — OLMESARTAN MEDOXOMIL 40 MG PO TABS: 40.0000 mg | ORAL_TABLET | Freq: Every day | ORAL | 2 refills | Status: AC

## 2024-07-24 MED ORDER — ONDANSETRON 4 MG PO TBDP: 4.0000 mg | ORAL_TABLET | Freq: Three times a day (TID) | ORAL | 0 refills | Status: AC | PRN

## 2024-07-24 MED ORDER — ATENOLOL 50 MG PO TABS: 50.0000 mg | ORAL_TABLET | Freq: Every day | ORAL | 2 refills | Status: AC

## 2024-07-24 MED ORDER — PANTOPRAZOLE SODIUM 40 MG PO TBEC: 40.0000 mg | DELAYED_RELEASE_TABLET | Freq: Every day | ORAL | 1 refills | Status: AC

## 2024-07-24 MED ORDER — BUSPIRONE HCL 10 MG PO TABS: 10.0000 mg | ORAL_TABLET | Freq: Two times a day (BID) | ORAL | 2 refills | Status: AC

## 2024-07-24 MED ORDER — CELECOXIB 100 MG PO CAPS: 100.0000 mg | ORAL_CAPSULE | Freq: Two times a day (BID) | ORAL | 0 refills | Status: AC

## 2024-07-24 MED ORDER — PREGABALIN 25 MG PO CAPS: 25.0000 mg | ORAL_CAPSULE | Freq: Two times a day (BID) | ORAL | 0 refills | Status: AC

## 2024-07-24 NOTE — Patient Instructions (Addendum)
 VISIT SUMMARY: Today, we addressed several of your health concerns, including a lump on your collarbone, weight loss, gastrointestinal symptoms, and chronic conditions such as aortic root dilation, essential tremor, and chronic pain. We also discussed your anxiety and depression management, and provided refills for your medications.  YOUR PLAN: PALPABLE MASS, LEFT SCROTAL/INGUINAL REGION: A lump was found in your left scrotal/inguinal area, which could be a cyst or a lymph node. -We ordered an ultrasound of the left scrotal/inguinal region. -A urinalysis was included in your lab work.  PALPABLE LYMPH NODE, CLAVICULAR REGION: A likely reactive lymph node was found in your collarbone area. -A white blood cell count was included in your lab work.  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 -Continue regular monitoring with cardiology and cardiothoracic surgery.  HYPERTENSION: Your blood pressure is well-controlled with your current medications. -We refilled your prescriptions for atenolol  50 mg and olmesartan  40 mg.  GENERALIZED ANXIETY DISORDER AND MAJOR DEPRESSIVE DISORDER: You have anxiety and depression, with improvement in anxiety symptoms using buspirone . -We refilled your buspirone  10 mg and increased it to twice daily. -We prescribed sertraline  for depression, starting at half of the lowest dose. -We discussed potential side effects of sertraline , including dry mouth, sleep disturbances, decreased libido, as well as the risk for worsening depression or suicidal thoughts. If you experience any of these side effects, please let us  know immediately and stop the medication.   CHRONIC PAIN SYNDROME WITH NEUROPATHY: You have chronic pain and suspected neuropathy, managed with pregabalin  and Celebrex . -We refilled your pregabalin  25 mg twice daily. -We refilled your  Celebrex . -We discussed a potential referral to [facility] if insurance issues persist.  GASTROESOPHAGEAL REFLUX DISEASE: You have GERD, managed with pantoprazole  as needed. -We refilled your pantoprazole .  CHRONIC NAUSEA AND VOMITING: You have chronic nausea and vomiting, managed with Zofran . -We refilled your Zofran .  GENERAL HEALTH MAINTENANCE: We discussed general health maintenance, including vaccinations and screenings. -We will schedule fasting blood work after the holidays. -We discussed the HPV vaccine series as an option.  If you have any problems before your next visit feel free to message me via MyChart (minor issues or questions) or call the office, otherwise you may reach out to schedule an office visit.  Thank you! Saddie Sacks, PA-C

## 2024-07-24 NOTE — Telephone Encounter (Signed)
 FYI Only or Action Required?: FYI only for provider: appointment scheduled on 07/24/2024.  Patient was last seen in primary care on n/a.  Called Nurse Triage reporting Hypertension.  Symptoms began x2 days.  Interventions attempted: Nothing.  Symptoms are: stable.  Triage Disposition: See PCP When Office is Open (Within 3 Days)  Patient/caregiver understands and will follow disposition?: Yes  Copied from CRM #[id number]. Topic: Clinical - Red Word Triage >> Jul 24, 2024  2:25 PM Carla L wrote: Red Word that prompted transfer to Nurse Triage: BP through the roof 173/145 around 2 days ago Reason for Disposition  Systolic BP >= 160 OR Diastolic >= 100  Answer Assessment - Initial Assessment Questions 1. BLOOD PRESSURE: What is your blood pressure? Did you take at least two measurements 5 minutes apart?     128/70 today 2. ONSET: When did you take your blood pressure?     today 3. HOW: How did you take your blood pressure? (e.g., automatic home BP monitor, visiting nurse)     automatic 4. HISTORY: Do you have a history of high blood pressure?     yes 5. MEDICINES: Are you taking any medicines for blood pressure? Have you missed any doses recently?     Yes has missed doses  6. OTHER SYMPTOMS: Do you have any symptoms? (e.g., blurred vision, chest pain, difficulty breathing, headache, weakness)     no 7. PREGNANCY: Is there any chance you are pregnant? When was your last menstrual period?    na  2 days BP was 173/145 - but patient did not have insurance and was streching medication and missing doses   Lump on top of collarbone, lump on left side of chest, one lump testicle area- lumps have been there longest a couple months and the latest a couple of days ago.  No pain, no drainage, movable  Protocols used: Blood Pressure - High-A-AH

## 2024-07-25 DIAGNOSIS — K219 Gastro-esophageal reflux disease without esophagitis: Secondary | ICD-10-CM | POA: Insufficient documentation

## 2024-07-25 DIAGNOSIS — R222 Localized swelling, mass and lump, trunk: Secondary | ICD-10-CM | POA: Insufficient documentation

## 2024-07-25 DIAGNOSIS — F411 Generalized anxiety disorder: Secondary | ICD-10-CM | POA: Insufficient documentation

## 2024-07-25 DIAGNOSIS — R110 Nausea: Secondary | ICD-10-CM | POA: Insufficient documentation

## 2024-07-25 DIAGNOSIS — G894 Chronic pain syndrome: Secondary | ICD-10-CM | POA: Insufficient documentation

## 2024-07-25 DIAGNOSIS — N5089 Other specified disorders of the male genital organs: Secondary | ICD-10-CM | POA: Insufficient documentation

## 2024-07-25 NOTE — Assessment & Plan Note (Signed)
Pantoprazole 40 mg every day

## 2024-07-25 NOTE — Assessment & Plan Note (Signed)
 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 Prefers to manage with non-opioid options due to history of drug use (now clean for 13 years).  Managed with pregabalin  and Celebrex  as needed. No recent follow-up with pain clinic due to insurance issues. - Refilled pregabalin  25 mg twice daily. - Refilled Celebrex . - Discussed potential referral to [facility] if insurance issues persist as he plans to re-establish with [facility].

## 2024-07-25 NOTE — Assessment & Plan Note (Signed)
 BP Goal <130/80. Blood pressure well-controlled with current medication regimen. - Refilled atenolol  50 mg and olmesartan  40 mg.

## 2024-07-25 NOTE — Assessment & Plan Note (Signed)
 Well managed with buspirone . Will increase form 10 mg every day to BID. Follow up in 3 months to reassess mood.

## 2024-07-25 NOTE — Assessment & Plan Note (Signed)
 Under regular monitoring by cardiology and cardiothoracic surgery. - Continue regular monitoring with cardiology and cardiothoracic surgery at [facility]

## 2024-07-25 NOTE — Assessment & Plan Note (Signed)
 Secondary to GERD when uncontrolled. Refilled GERD maintenance medication. Refilled Zofran  for as needed use.

## 2024-07-25 NOTE — Assessment & Plan Note (Signed)
 Palpable, soft, mobile, non-fixed, non-growing, non-painful 1 mm mass noted directly over left clavicle. Stable and present for many years. Likely reactive lymph node. Will check blood counts with labs. No red flag symptoms. Offered ultrasound for evaluation. Patient opted for watchful waiting and will notify of any changes to the area.
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
## 2024-07-25 NOTE — Assessment & Plan Note (Signed)
 Essential tremor. Likely genetic, present since birth. Little improvement on atenolol  for blood pressure. Patient declines further tx at this time as he reports he is used to it and it does not interfere with his daily life at this time.

## 2024-07-25 NOTE — Assessment & Plan Note (Signed)
 Palpable 1 mm mass noted to left testis that is TTP. Differentials include cyst, lymph node, epididymitis, malignancy. UA today.  POC GC/CZ test today. RPR, HIV will be done at blood draw next week. Ultrasound of scrotum ordered today to further evaluate mass.

## 2024-07-25 NOTE — Assessment & Plan Note (Signed)
 Reports improvement in anxiety symptoms with buspirone . Discussed potential addition of sertraline  for depression. - Refilled buspirone  10 mg, increased to twice daily. - Prescribed sertraline  for depression, starting at half of the lowest dose. - Discussed potential side effects of sertraline , including dry mouth, sleep disturbances, decreased libido, and risk of worsening mood/increased risk of suicidal thoughts.  - patient open to a trial of 25 mg sertraline . Will notify if he experiences any side effects. Advised to stop medication immediately if mood worsens or if he has increased frequency of SI/HI.

## 2024-07-27 LAB — STI PROFILE, CT/NG/TV
Chlamydia by NAA: NEGATIVE
Gonococcus by NAA: NEGATIVE
Trich vag by NAA: NEGATIVE

## 2024-07-28 ENCOUNTER — Ambulatory Visit: Payer: Self-pay

## 2024-08-09 ENCOUNTER — Ambulatory Visit
Admission: RE | Admit: 2024-08-09 | Discharge: 2024-08-09 | Disposition: A | Discharge status: Home / Self Care | Source: Ambulatory Visit

## 2024-08-09 ENCOUNTER — Other Ambulatory Visit

## 2024-08-09 DIAGNOSIS — N5089 Other specified disorders of the male genital organs: Secondary | ICD-10-CM

## 2024-08-09 DIAGNOSIS — Z113 Encounter for screening for infections with a predominantly sexual mode of transmission: Secondary | ICD-10-CM

## 2024-08-09 DIAGNOSIS — Z1159 Encounter for screening for other viral diseases: Secondary | ICD-10-CM

## 2024-08-09 DIAGNOSIS — Z130 Encounter for screening for diseases of the blood and blood-forming organs and certain disorders involving the immune mechanism: Secondary | ICD-10-CM

## 2024-08-10 LAB — CBC WITH DIFFERENTIAL/PLATELET
Basophils Absolute: 0 x10E3/uL (ref 0.0–0.2)
Basos: 1 %
EOS (ABSOLUTE): 0.2 x10E3/uL (ref 0.0–0.4)
Eos: 4 %
Hematocrit: 45.1 % (ref 37.5–51.0)
Hemoglobin: 14.6 g/dL (ref 13.0–17.7)
Immature Grans (Abs): 0 x10E3/uL (ref 0.0–0.1)
Immature Granulocytes: 0 %
Lymphocytes Absolute: 1.3 x10E3/uL (ref 0.7–3.1)
Lymphs: 20 %
MCH: 28 pg (ref 26.6–33.0)
MCHC: 32.4 g/dL (ref 31.5–35.7)
MCV: 86 fL (ref 79–97)
Monocytes Absolute: 0.7 x10E3/uL (ref 0.1–0.9)
Monocytes: 12 %
Neutrophils Absolute: 3.9 x10E3/uL (ref 1.4–7.0)
Neutrophils: 62 %
Platelets: 216 x10E3/uL (ref 150–450)
RBC: 5.22 x10E6/uL (ref 4.14–5.80)
RDW: 13.1 % (ref 11.6–15.4)
WBC: 6.2 x10E3/uL (ref 3.4–10.8)

## 2024-08-10 LAB — COMPREHENSIVE METABOLIC PANEL WITH GFR
ALT: 38 IU/L (ref 0–44)
AST: 21 IU/L (ref 0–40)
Albumin: 4.5 g/dL (ref 4.1–5.1)
Alkaline Phosphatase: 62 IU/L (ref 47–123)
BUN/Creatinine Ratio: 12 (ref 9–20)
BUN: 11 mg/dL (ref 6–20)
Bilirubin Total: 0.6 mg/dL (ref 0.0–1.2)
CO2: 25 mmol/L (ref 20–29)
Calcium: 10.1 mg/dL (ref 8.7–10.2)
Chloride: 97 mmol/L (ref 96–106)
Creatinine, Ser: 0.89 mg/dL (ref 0.76–1.27)
Globulin, Total: 3 g/dL (ref 1.5–4.5)
Glucose: 72 mg/dL (ref 70–99)
Potassium: 4.2 mmol/L (ref 3.5–5.2)
Sodium: 134 mmol/L (ref 134–144)
Total Protein: 7.5 g/dL (ref 6.0–8.5)
eGFR: 115 mL/min/1.73

## 2024-08-10 LAB — MICROSCOPIC EXAMINATION
Bacteria, UA: NONE SEEN
Casts: NONE SEEN /LPF
Epithelial Cells (non renal): NONE SEEN /HPF (ref 0–10)
RBC, Urine: NONE SEEN /HPF (ref 0–2)

## 2024-08-10 LAB — LIPID PANEL
Chol/HDL Ratio: 6.3 ratio — ABNORMAL HIGH (ref 0.0–5.0)
Cholesterol, Total: 196 mg/dL (ref 100–199)
HDL: 31 mg/dL — ABNORMAL LOW
LDL Chol Calc (NIH): 141 mg/dL — ABNORMAL HIGH (ref 0–99)
Triglycerides: 132 mg/dL (ref 0–149)
VLDL Cholesterol Cal: 24 mg/dL (ref 5–40)

## 2024-08-10 LAB — URINALYSIS, ROUTINE W REFLEX MICROSCOPIC
Bilirubin, UA: NEGATIVE
Glucose, UA: NEGATIVE
Ketones, UA: NEGATIVE
Nitrite, UA: NEGATIVE
Protein,UA: NEGATIVE
RBC, UA: NEGATIVE
Specific Gravity, UA: 1.023 (ref 1.005–1.030)
Urobilinogen, Ur: 0.2 mg/dL (ref 0.2–1.0)
pH, UA: 5.5 (ref 5.0–7.5)

## 2024-08-10 LAB — HEPATITIS C ANTIBODY: Hep C Virus Ab: NONREACTIVE

## 2024-08-10 LAB — THYROID PANEL WITH TSH
Free Thyroxine Index: 2.8 (ref 1.2–4.9)
T3 Uptake Ratio: 28 % (ref 24–39)
T4, Total: 10.1 ug/dL (ref 4.5–12.0)
TSH: 0.785 u[IU]/mL (ref 0.450–4.500)

## 2024-08-10 LAB — HIV ANTIBODY (ROUTINE TESTING W REFLEX): HIV Screen 4th Generation wRfx: NONREACTIVE

## 2024-08-10 LAB — SYPHILIS: RPR W/REFLEX TO RPR TITER AND TREPONEMAL ANTIBODIES, TRADITIONAL SCREENING AND DIAGNOSIS ALGORITHM: RPR Ser Ql: NONREACTIVE

## 2024-08-10 LAB — HEMOGLOBIN A1C
Est. average glucose Bld gHb Est-mCnc: 100 mg/dL
Hgb A1c MFr Bld: 5.1 % (ref 4.8–5.6)

## 2024-10-05 ENCOUNTER — Ambulatory Visit
